# Patient Record
Sex: Male | Born: 1955 | Race: White | Hispanic: No | Marital: Single | Smoking: Current every day smoker
Health system: Southern US, Community
[De-identification: ages and names within clinical notes are randomized; demographics above are authoritative.]

---

## 2005-05-16 ENCOUNTER — Emergency Department: Payer: Self-pay | Admitting: Emergency Medicine

## 2011-09-09 ENCOUNTER — Emergency Department: Payer: Self-pay | Admitting: Emergency Medicine

## 2018-05-19 ENCOUNTER — Inpatient Hospital Stay
Admission: EM | Admit: 2018-05-19 | Discharge: 2018-05-23 | DRG: 641 | Disposition: A | Discharge status: Home Health | Payer: Self-pay | Attending: Family Medicine | Admitting: Family Medicine

## 2018-05-19 ENCOUNTER — Emergency Department: Payer: Self-pay

## 2018-05-19 ENCOUNTER — Other Ambulatory Visit: Payer: Self-pay

## 2018-05-19 DIAGNOSIS — F1010 Alcohol abuse, uncomplicated: Secondary | ICD-10-CM | POA: Diagnosis present

## 2018-05-19 DIAGNOSIS — E440 Moderate protein-calorie malnutrition: Secondary | ICD-10-CM

## 2018-05-19 DIAGNOSIS — Z79899 Other long term (current) drug therapy: Secondary | ICD-10-CM

## 2018-05-19 DIAGNOSIS — E871 Hypo-osmolality and hyponatremia: Principal | ICD-10-CM

## 2018-05-19 DIAGNOSIS — R001 Bradycardia, unspecified: Secondary | ICD-10-CM | POA: Diagnosis present

## 2018-05-19 DIAGNOSIS — R251 Tremor, unspecified: Secondary | ICD-10-CM

## 2018-05-19 DIAGNOSIS — M6282 Rhabdomyolysis: Secondary | ICD-10-CM | POA: Diagnosis present

## 2018-05-19 DIAGNOSIS — E876 Hypokalemia: Secondary | ICD-10-CM | POA: Diagnosis present

## 2018-05-19 DIAGNOSIS — K5900 Constipation, unspecified: Secondary | ICD-10-CM | POA: Diagnosis present

## 2018-05-19 DIAGNOSIS — F329 Major depressive disorder, single episode, unspecified: Secondary | ICD-10-CM | POA: Diagnosis present

## 2018-05-19 DIAGNOSIS — F17210 Nicotine dependence, cigarettes, uncomplicated: Secondary | ICD-10-CM | POA: Diagnosis present

## 2018-05-19 DIAGNOSIS — R64 Cachexia: Secondary | ICD-10-CM | POA: Diagnosis present

## 2018-05-19 DIAGNOSIS — M62838 Other muscle spasm: Secondary | ICD-10-CM | POA: Diagnosis present

## 2018-05-19 DIAGNOSIS — I10 Essential (primary) hypertension: Secondary | ICD-10-CM

## 2018-05-19 DIAGNOSIS — J449 Chronic obstructive pulmonary disease, unspecified: Secondary | ICD-10-CM

## 2018-05-19 DIAGNOSIS — Z681 Body mass index (BMI) 19 or less, adult: Secondary | ICD-10-CM

## 2018-05-19 LAB — URINE DRUG SCREEN, QUALITATIVE (ARMC ONLY)
Amphetamines, Ur Screen: NOT DETECTED
Barbiturates, Ur Screen: NOT DETECTED
Benzodiazepine, Ur Scrn: NOT DETECTED
CANNABINOID 50 NG, UR ~~LOC~~: NOT DETECTED
Cocaine Metabolite,Ur ~~LOC~~: NOT DETECTED
MDMA (ECSTASY) UR SCREEN: NOT DETECTED
Methadone Scn, Ur: NOT DETECTED
Opiate, Ur Screen: NOT DETECTED
PHENCYCLIDINE (PCP) UR S: NOT DETECTED
Tricyclic, Ur Screen: NOT DETECTED

## 2018-05-19 LAB — CBC
HCT: 37.8 % — ABNORMAL LOW (ref 40.0–52.0)
Hemoglobin: 13.8 g/dL (ref 13.0–18.0)
MCH: 33.5 pg (ref 26.0–34.0)
MCHC: 36.5 g/dL — ABNORMAL HIGH (ref 32.0–36.0)
MCV: 91.8 fL (ref 80.0–100.0)
Platelets: 250 10*3/uL (ref 150–440)
RBC: 4.11 MIL/uL — ABNORMAL LOW (ref 4.40–5.90)
RDW: 13.1 % (ref 11.5–14.5)
WBC: 12.4 10*3/uL — ABNORMAL HIGH (ref 3.8–10.6)

## 2018-05-19 LAB — BLOOD GAS, VENOUS
ACID-BASE EXCESS: 8 mmol/L — AB (ref 0.0–2.0)
Bicarbonate: 33.4 mmol/L — ABNORMAL HIGH (ref 20.0–28.0)
PATIENT TEMPERATURE: 37
pCO2, Ven: 48 mmHg (ref 44.0–60.0)
pH, Ven: 7.45 — ABNORMAL HIGH (ref 7.250–7.430)

## 2018-05-19 LAB — SODIUM
SODIUM: 123 mmol/L — AB (ref 135–145)
Sodium: 120 mmol/L — ABNORMAL LOW (ref 135–145)
Sodium: 123 mmol/L — ABNORMAL LOW (ref 135–145)

## 2018-05-19 LAB — BASIC METABOLIC PANEL
Anion gap: 12 (ref 5–15)
BUN: <5 mg/dL — ABNORMAL LOW (ref 6–20)
CO2: 29 mmol/L (ref 22–32)
Calcium: 8.5 mg/dL — ABNORMAL LOW (ref 8.9–10.3)
Chloride: 70 mmol/L — ABNORMAL LOW (ref 101–111)
Creatinine, Ser: 0.5 mg/dL — ABNORMAL LOW (ref 0.61–1.24)
GFR calc Af Amer: >60 mL/min (ref >60)
GFR calc non Af Amer: >60 mL/min (ref >60)
Glucose, Bld: 102 mg/dL — ABNORMAL HIGH (ref 65–99)
Potassium: 3.1 mmol/L — ABNORMAL LOW (ref 3.5–5.1)
Sodium: 111 mmol/L — CL (ref 135–145)

## 2018-05-19 LAB — TSH: TSH: 0.755 u[IU]/mL (ref 0.350–4.500)

## 2018-05-19 LAB — SODIUM, URINE, RANDOM: Sodium, Ur: <10 mmol/L

## 2018-05-19 LAB — MAGNESIUM: MAGNESIUM: 1 mg/dL — AB (ref 1.7–2.4)

## 2018-05-19 LAB — ETHANOL: Alcohol, Ethyl (B): <10 mg/dL (ref <10)

## 2018-05-19 LAB — PROTEIN / CREATININE RATIO, URINE
Creatinine, Urine: 13 mg/dL
Total Protein, Urine: <6 mg/dL

## 2018-05-19 LAB — CHLORIDE, URINE, RANDOM: Chloride Urine: <15 mmol/L

## 2018-05-19 LAB — TROPONIN I: Troponin I: <0.03 ng/mL (ref <0.03)

## 2018-05-19 LAB — CK: Total CK: 1597 U/L — ABNORMAL HIGH (ref 49–397)

## 2018-05-19 MED ORDER — POTASSIUM CHLORIDE IN NACL 20-0.9 MEQ/L-% IV SOLN
INTRAVENOUS | Status: DC
  Administered 2018-05-19: 14:00:00 via INTRAVENOUS
  Filled 2018-05-19 (×3): qty 1000

## 2018-05-19 MED ORDER — CITALOPRAM HYDROBROMIDE 20 MG PO TABS
40.0000 mg | ORAL_TABLET | Freq: Every day | ORAL | Status: DC
Start: 2018-05-19 — End: 2018-05-23
  Administered 2018-05-19 – 2018-05-23 (×5): 40 mg via ORAL
  Filled 2018-05-19 (×5): qty 2

## 2018-05-19 MED ORDER — POTASSIUM CHLORIDE CRYS ER 20 MEQ PO TBCR
60.0000 meq | EXTENDED_RELEASE_TABLET | Freq: Once | ORAL | Status: AC
  Administered 2018-05-19: 60 meq via ORAL
  Filled 2018-05-19: qty 3

## 2018-05-19 MED ORDER — POTASSIUM CL IN DEXTROSE 5% 20 MEQ/L IV SOLN
20.0000 meq | INTRAVENOUS | Status: DC
  Administered 2018-05-19 – 2018-05-20 (×2): 20 meq via INTRAVENOUS
  Filled 2018-05-19 (×3): qty 1000

## 2018-05-19 MED ORDER — ONDANSETRON HCL 4 MG/2ML IJ SOLN: 4.0000 mg | Freq: Four times a day (QID) | INTRAMUSCULAR | Status: DC | PRN

## 2018-05-19 MED ORDER — FAMOTIDINE IN NACL 20-0.9 MG/50ML-% IV SOLN
20.0000 mg | Freq: Two times a day (BID) | INTRAVENOUS | Status: DC
  Administered 2018-05-19 (×2): 20 mg via INTRAVENOUS
  Filled 2018-05-19 (×2): qty 50

## 2018-05-19 MED ORDER — BISACODYL 10 MG RE SUPP
10.0000 mg | Freq: Every day | RECTAL | Status: DC | PRN
  Filled 2018-05-19: qty 1

## 2018-05-19 MED ORDER — HEPARIN SODIUM (PORCINE) 5000 UNIT/ML IJ SOLN
5000.0000 [IU] | Freq: Three times a day (TID) | INTRAMUSCULAR | Status: DC
  Administered 2018-05-19 – 2018-05-23 (×12): 5000 [IU] via SUBCUTANEOUS
  Filled 2018-05-19 (×12): qty 1

## 2018-05-19 MED ORDER — SODIUM CHLORIDE 0.9 % IV SOLN
Freq: Once | INTRAVENOUS | Status: DC
  Administered 2018-05-19: 13:00:00 via INTRAVENOUS

## 2018-05-19 MED ORDER — DOCUSATE SODIUM 100 MG PO CAPS
100.0000 mg | ORAL_CAPSULE | Freq: Two times a day (BID) | ORAL | Status: DC
  Administered 2018-05-19 – 2018-05-23 (×7): 100 mg via ORAL
  Filled 2018-05-19 (×9): qty 1

## 2018-05-19 MED ORDER — ATORVASTATIN CALCIUM 20 MG PO TABS
20.0000 mg | ORAL_TABLET | Freq: Every day | ORAL | Status: DC
Start: 2018-05-19 — End: 2018-05-20
  Administered 2018-05-19: 20 mg via ORAL
  Filled 2018-05-19 (×2): qty 1

## 2018-05-19 MED ORDER — SODIUM CHLORIDE 0.9 % IV SOLN
Freq: Once | INTRAVENOUS | Status: AC
  Administered 2018-05-19: 10:00:00 via INTRAVENOUS

## 2018-05-19 MED ORDER — LORAZEPAM 2 MG/ML IJ SOLN: 1.0000 mg | Freq: Once | INTRAMUSCULAR | Status: DC

## 2018-05-19 MED ORDER — CLONAZEPAM 0.5 MG PO TABS
1.0000 mg | ORAL_TABLET | Freq: Three times a day (TID) | ORAL | Status: DC
  Administered 2018-05-19 – 2018-05-23 (×11): 1 mg via ORAL
  Filled 2018-05-19 (×12): qty 2

## 2018-05-19 MED ORDER — LISINOPRIL 20 MG PO TABS
20.0000 mg | ORAL_TABLET | Freq: Every day | ORAL | Status: DC
  Administered 2018-05-19 – 2018-05-23 (×5): 20 mg via ORAL
  Filled 2018-05-19 (×5): qty 1

## 2018-05-19 MED ORDER — MAGNESIUM SULFATE 2 GM/50ML IV SOLN
2.0000 g | Freq: Once | INTRAVENOUS | Status: AC
  Administered 2018-05-19: 2 g via INTRAVENOUS
  Filled 2018-05-19: qty 50

## 2018-05-19 MED ORDER — ALBUTEROL SULFATE (2.5 MG/3ML) 0.083% IN NEBU
5.0000 mg | INHALATION_SOLUTION | Freq: Once | RESPIRATORY_TRACT | Status: AC
  Administered 2018-05-19: 5 mg via RESPIRATORY_TRACT
  Filled 2018-05-19: qty 6

## 2018-05-19 MED ORDER — DIAZEPAM 5 MG PO TABS
10.0000 mg | ORAL_TABLET | Freq: Once | ORAL | Status: AC
  Administered 2018-05-19: 10 mg via ORAL
  Filled 2018-05-19: qty 2

## 2018-05-19 MED ORDER — IPRATROPIUM-ALBUTEROL 0.5-2.5 (3) MG/3ML IN SOLN
3.0000 mL | Freq: Three times a day (TID) | RESPIRATORY_TRACT | Status: DC
  Administered 2018-05-20 – 2018-05-22 (×7): 3 mL via RESPIRATORY_TRACT
  Filled 2018-05-19: qty 30
  Filled 2018-05-19 (×5): qty 3

## 2018-05-19 MED ORDER — METHYLPREDNISOLONE SODIUM SUCC 125 MG IJ SOLR
125.0000 mg | Freq: Once | INTRAMUSCULAR | Status: AC
  Administered 2018-05-19: 125 mg via INTRAVENOUS
  Filled 2018-05-19: qty 2

## 2018-05-19 MED ORDER — DESMOPRESSIN ACETATE 4 MCG/ML IJ SOLN
2.0000 ug | Freq: Once | INTRAMUSCULAR | Status: AC
  Administered 2018-05-19: 2 ug via SUBCUTANEOUS
  Filled 2018-05-19: qty 1

## 2018-05-19 MED ORDER — LORAZEPAM 2 MG/ML IJ SOLN: 0.5000 mg | INTRAMUSCULAR | Status: DC | PRN

## 2018-05-19 MED ORDER — ACETAMINOPHEN 650 MG RE SUPP: 650.0000 mg | Freq: Four times a day (QID) | RECTAL | Status: DC | PRN

## 2018-05-19 MED ORDER — ENSURE ENLIVE PO LIQD: 237.0000 mL | Freq: Two times a day (BID) | ORAL | Status: DC

## 2018-05-19 MED ORDER — ONDANSETRON HCL 4 MG/2ML IJ SOLN
4.0000 mg | Freq: Once | INTRAMUSCULAR | Status: AC
  Administered 2018-05-19: 4 mg via INTRAVENOUS
  Filled 2018-05-19: qty 2

## 2018-05-19 MED ORDER — ACETAMINOPHEN 325 MG PO TABS: 650.0000 mg | ORAL_TABLET | Freq: Four times a day (QID) | ORAL | Status: DC | PRN

## 2018-05-19 MED ORDER — ONDANSETRON HCL 4 MG PO TABS: 4.0000 mg | ORAL_TABLET | Freq: Four times a day (QID) | ORAL | Status: DC | PRN

## 2018-05-19 MED ORDER — LORAZEPAM 2 MG/ML IJ SOLN
1.0000 mg | Freq: Once | INTRAMUSCULAR | Status: AC
  Administered 2018-05-19: 1 mg via INTRAVENOUS
  Filled 2018-05-19: qty 1

## 2018-05-19 MED ORDER — DOXYCYCLINE HYCLATE 100 MG PO TABS
100.0000 mg | ORAL_TABLET | Freq: Two times a day (BID) | ORAL | Status: DC
  Administered 2018-05-19 – 2018-05-21 (×4): 100 mg via ORAL
  Filled 2018-05-19 (×6): qty 1

## 2018-05-19 MED ORDER — IPRATROPIUM-ALBUTEROL 0.5-2.5 (3) MG/3ML IN SOLN
3.0000 mL | Freq: Four times a day (QID) | RESPIRATORY_TRACT | Status: DC
  Administered 2018-05-19 (×2): 3 mL via RESPIRATORY_TRACT
  Filled 2018-05-19 (×2): qty 3

## 2018-05-19 MED ORDER — IPRATROPIUM-ALBUTEROL 0.5-2.5 (3) MG/3ML IN SOLN
3.0000 mL | Freq: Once | RESPIRATORY_TRACT | Status: AC
  Administered 2018-05-19: 3 mL via RESPIRATORY_TRACT
  Filled 2018-05-19: qty 3

## 2018-05-19 MED ORDER — PROPRANOLOL HCL 40 MG PO TABS
40.0000 mg | ORAL_TABLET | Freq: Two times a day (BID) | ORAL | Status: DC
  Administered 2018-05-19 – 2018-05-20 (×3): 40 mg via ORAL
  Filled 2018-05-19 (×6): qty 1

## 2018-05-19 NOTE — ED Triage Notes (Signed)
He arrives today via [facility] from home with reports of COPD exacerbation  - shortness of breath present upon arrival  Pt verbalizing paint to his bilateral legs  - they are shaking during triage  Nausea with 2 episodes of emesis over the last 24 hours

## 2018-05-19 NOTE — H&P (Signed)
History and Physical    LYRIK BURESH ZOX:[id number] DOB: 1956/11/18 DOA: 05/19/2018  Referring physician: Dr. Mayford Knife PCP: [facility]  Specialists: none  Chief Complaint: weakness and shaking  HPI: Jesus Turner is a 62 y.o. male has a past medical history significant for COPD who presents with acute onset(24h) of weakness and tremors with SOB. No fever. Deneis CP or palpitations. No change in bowels or bladder. Some vomiting but no diarrhea. In ER, sodium very low and CK elevated c/w rhabdomyolysis. Denies ETOH abuse. Does smoke. He is now admitted  Review of Systems: The patient denies anorexia, fever, weight loss,, vision loss, decreased hearing, hoarseness, chest pain, syncope, dyspnea on exertion, peripheral edema,  hemoptysis, abdominal pain, melena, hematochezia, severe indigestion/heartburn, hematuria, incontinence, genital sores, muscle weakness, suspicious skin lesions, transient blindness,  depression, unusual weight change, abnormal bleeding, enlarged lymph nodes, angioedema, and breast masses.   History reviewed. No pertinent past medical history. History reviewed. No pertinent surgical history. Social History:  reports that he has been smoking.  He has never used smokeless tobacco. He reports that he drinks alcohol. His drug history is not on file.  No Known Allergies  History reviewed. No pertinent family history.  Prior to Admission medications   Medication Sig Start Date End Date Taking? Authorizing Provider  citalopram (CELEXA) 40 MG tablet Take 1 tablet by mouth daily. 03/12/18  Yes [provider]  atorvastatin (LIPITOR) 20 MG tablet Take 1 tablet by mouth at bedtime. 02/14/18   [provider]  lisinopril (PRINIVIL,ZESTRIL) 20 MG tablet Take 20 mg by mouth daily. 02/19/18   [provider]  metoprolol tartrate (LOPRESSOR) 50 MG tablet Take 50 mg by mouth 2 (two) times daily. 02/14/18   [provider]    Physical Exam: Vitals:   05/19/18 1000 05/19/18 1045 05/19/18 1100 05/19/18 1232  BP: 116/82  (!) 107/95 (!) 136/106  Pulse: 99 (!) 107 (!) 107 (!) 103  Resp:      Temp:      TempSrc:      SpO2: 97% 100% 98%   Weight:      Height:         General:  No apparent distress, [place]/AT, cachectic  Eyes: PERRL, EOMI, no scleral icterus, conjunctiva clear  ENT: moist oropharynx without exudate, TM's benign, dentition poor  Neck: supple, no lymphadenopathy. No bruits or thyromegaly  Cardiovascular: rapid rate with regular without MRG; 2+ peripheral pulses, no JVD, no peripheral edema  Respiratory: CTA biL, good air movement without wheezing, rhonchi or crackled. Respiratory effort normal  Abdomen: soft, non tender to palpation, positive bowel sounds, no guarding, no rebound  Skin: no rashes or lesions  Musculoskeletal: normal bulk and tone, no joint swelling  Psychiatric: normal mood and affect, A&OX3  Neurologic: CN 2-12 grossly intact, Motor strength 5/5 in all 4 groups with symmetric DTR's and non-focal sensory exam. Tremulous  Labs on Admission:  Basic Metabolic Panel: Recent Labs  Lab 05/19/18 0953  NA 111*  K 3.1*  CL 70*  CO2 29  GLUCOSE 102*  BUN <5*  CREATININE 0.50*  CALCIUM 8.5*  MG 1.0*   Liver Function Tests: No results for input(s): AST, ALT, ALKPHOS, BILITOT, PROT, ALBUMIN in the last 168 hours. No results for input(s): LIPASE, AMYLASE in the last 168 hours. No results for input(s): AMMONIA in the last 168 hours. CBC: Recent Labs  Lab 05/19/18 0953  WBC 12.4*  HGB 13.8  HCT 37.8*  MCV  91.8  PLT 250   Cardiac Enzymes: Recent Labs  Lab 05/19/18 0953 05/19/18 1035  CKTOTAL 1,597*  --   TROPONINI  --  <0.03    BNP (last 3 results) No results for input(s): BNP in the last 8760 hours.  ProBNP (last 3 results) No results for input(s): PROBNP in the last 8760 hours.  CBG: No results for input(s): GLUCAP in the last 168  hours.  Radiological Exams on Admission: Dg Chest 2 View  Result Date: 05/19/2018 CLINICAL DATA:  Shortness of breath.  COPD exacerbation.  Smoker. EXAM: CHEST - 2 VIEW COMPARISON:  None. FINDINGS: Normal sized heart. Clear lungs. The lungs are hyperexpanded. Mildly elevated right hemidiaphragm. 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 Mild scoliosis. Diffuse osteopenia. Old, healed bilateral clavicle fractures. Old, healed bilateral rib fractures. Thoracic spine degenerative changes. There are also mild compression deformities of two of the upper thoracic spine vertebrae with no acute fracture lines or bony retropulsion seen. Cervical spine degenerative changes. Bilateral carotid artery calcifications, greater on the right. IMPRESSION: 1. 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 If the patient has no abdominal symptoms, this does not require further evaluation. If there are abdominal symptoms, a two view radiographic examination of the abdomen would be recommended, including a left lateral decubitus view. 2. Mild changes of COPD. 3. Bilateral atheromatous carotid artery calcifications, greater on the right. Electronically Signed   By: Beckie Salts M.D.   On: 05/19/2018 10:29   Dg Abd 2 Views  Result Date: 05/19/2018 CLINICAL DATA:  Possible free air seen on prior chest x-ray. EXAM: ABDOMEN - 2 VIEW COMPARISON:  Chest radiograph 05/19/2018 FINDINGS: Diffuse sub pathologic gaseous distension of small bowel loops in the central abdomen. Preserved colonic bowel gas pattern. No evidence of free intra-abdominal air. IMPRESSION: Nonspecific nonobstructive bowel gas pattern with diffuse sub pathologic gaseous distension of small bowel loops. No evidence of free intra-abdominal air. Electronically Signed   By: Ted Mcalpine M.D.   On: 05/19/2018 11:57    EKG: Independently  reviewed.  Assessment/Plan Principal Problem:   Hyponatremia Active Problems:   Tremor   COPD (chronic obstructive pulmonary disease) (HCC)   HTN (hypertension)   Will admit to floor with IV fluids and consult Nephrology and Neuro. Follow CK's. Begin BP meds and optimize pulmonary regimen. Wean off O2. Repeat labs in AM  Diet: regular Fluids: NS with K+@100  DVT Prophylaxis: SQ Heparin  Code Status: FULL  Family Communication: yes  Disposition Plan: home  Time spent: 50 min

## 2018-05-19 NOTE — Consult Note (Signed)
Date: 05/19/2018                  Patient Name:  Jesus Turner  MRN: [id number]  DOB: 18-Apr-1956  Age / Sex: 62 y.o., male         PCP: [facility]                 Service Requesting Consult: IM/ Marguarite Arbour, MD                 Reason for Consult:  Hyponatremia            History of Present Illness: Patient is a 62 y.o. male with medical problems of COPD,  who was admitted to [facility] on 05/19/2018 for evaluation of confusion.  Information is provided by patient's mother who does not live with him.  Patient just received Klonopin therefore is very lethargic and is not able to provide any meaningful information.  Per ER notes, he presented for difficulty breathing and bilateral leg pain.  Family also reports that he has been shaking a lot recently.  He also had 2 episodes of vomiting prior to presenting. In the ER, he was noted to have acutely low sodium of 111.  Potassium, chloride, magnesium are all low.  CK level is high at almost 1600.  Urine toxicity screen is negative.  Blood alcohol level is negative.  No historical lab results are available for comparison  Medications: Outpatient medications: Medications Prior to Admission  Medication Sig Dispense Refill Last Dose  . citalopram (CELEXA) 40 MG tablet Take 1 tablet by mouth daily.  0   . atorvastatin (LIPITOR) 20 MG tablet Take 1 tablet by mouth at bedtime.  0   . lisinopril (PRINIVIL,ZESTRIL) 20 MG tablet Take 20 mg by mouth daily.  0   . metoprolol tartrate (LOPRESSOR) 50 MG tablet Take 50 mg by mouth 2 (two) times daily.  0     Current medications: Current Facility-Administered Medications  Medication Dose Route Frequency Provider Last Rate Last Dose  . 0.9 % NaCl with KCl 20 mEq/ L  infusion   Intravenous Continuous Marguarite Arbour, MD 100 mL/hr at 05/19/18 1421    . acetaminophen (TYLENOL) tablet 650 mg  650 mg Oral Q6H PRN Marguarite Arbour, MD       Or  . acetaminophen (TYLENOL) suppository  650 mg  650 mg Rectal Q6H PRN Marguarite Arbour, MD      . atorvastatin (LIPITOR) tablet 20 mg  20 mg Oral QHS Marguarite Arbour, MD      . bisacodyl (DULCOLAX) suppository 10 mg  10 mg Rectal Daily PRN Marguarite Arbour, MD      . citalopram (CELEXA) tablet 40 mg  40 mg Oral Daily Marguarite Arbour, MD   40 mg at 05/19/18 1427  . clonazePAM (KLONOPIN) tablet 1 mg  1 mg Oral Q8H Marguarite Arbour, MD   1 mg at 05/19/18 1427  . docusate sodium (COLACE) capsule 100 mg  100 mg Oral BID Marguarite Arbour, MD   100 mg at 05/19/18 1427  . doxycycline (VIBRA-TABS) tablet 100 mg  100 mg Oral Q12H Marguarite Arbour, MD   100 mg at 05/19/18 1428  . famotidine (PEPCID) IVPB 20 mg premix  20 mg Intravenous Q12H Marguarite Arbour, MD   Stopped at 05/19/18 1516  . heparin injection 5,000 Units  5,000 Units Subcutaneous Q8H Sparks, Duane Lope, MD  5,000 Units at 05/19/18 1426  . ipratropium-albuterol (DUONEB) 0.5-2.5 (3) MG/3ML nebulizer solution 3 mL  3 mL Nebulization QID Marguarite Arbour, MD   3 mL at 05/19/18 1455  . lisinopril (PRINIVIL,ZESTRIL) tablet 20 mg  20 mg Oral Daily Marguarite Arbour, MD   20 mg at 05/19/18 1428  . LORazepam (ATIVAN) injection 0.5 mg  0.5 mg Intravenous Q4H PRN Marguarite Arbour, MD      . ondansetron [facility]) tablet 4 mg  4 mg Oral Q6H PRN Marguarite Arbour, MD       Or  . ondansetron (ZOFRAN) injection 4 mg  4 mg Intravenous Q6H PRN Marguarite Arbour, MD      . propranolol (INDERAL) tablet 40 mg  40 mg Oral BID Marguarite Arbour, MD   40 mg at 05/19/18 1428      Allergies: No Known Allergies    Past Medical History: History reviewed. No pertinent past medical history.   Past Surgical History: History reviewed. No pertinent surgical history.   Family History: History reviewed. No pertinent family history.   Social History: Social History   Socioeconomic History  . Marital status: Single    Spouse name: Not on file  . Number of children: Not on file  .  Years of education: Not on file  . Highest education level: Not on file  Occupational History  . Not on file  Social Needs  . Financial resource strain: Not on file  . Food insecurity:    Worry: Not on file    Inability: Not on file  . Transportation needs:    Medical: Not on file    Non-medical: Not on file  Tobacco Use  . Smoking status: Current Every Day Smoker  . Smokeless tobacco: Never Used  Substance and Sexual Activity  . Alcohol use: Yes  . Drug use: Not on file  . Sexual activity: Not on file  Lifestyle  . Physical activity:    Days per week: Not on file    Minutes per session: Not on file  . Stress: Not on file  Relationships  . Social connections:    Talks on phone: Not on file    Gets together: Not on file    Attends religious service: Not on file    Active member of club or organization: Not on file    Attends meetings of clubs or organizations: Not on file    Relationship status: Not on file  . Intimate partner violence:    Fear of current or ex partner: Not on file    Emotionally abused: Not on file    Physically abused: Not on file    Forced sexual activity: Not on file  Other Topics Concern  . Not on file  Social History Narrative  . Not on file     Review of Systems: Not available as patient is very lethargic Gen:  HEENT:  CV:  Resp:  GI: GU :  MS:  Derm:   Psych: Heme:  Neuro:  Endocrine  Vital Signs: Blood pressure 124/81, pulse 98, temperature 98 F (36.7 C), temperature source Oral, resp. rate (!) 22, height  (1.854 m), weight 65.7 kg (144 lb 12.8 oz), SpO2 94 %.   Intake/Output Summary (Last 24 hours) at 05/19/2018 1646 Last data filed at 05/19/2018 1516 Gross per 24 hour  Intake 84 ml  Output 950 ml  Net -866 ml    Weight trends: [employer]   05/19/18 [phone]  05/19/18 1350  Weight: 63 kg (139 lb) 65.7 kg (144 lb 12.8 oz)    Physical Exam: General:  Thin, cachectic, laying in the bed, lethargic  HEENT  moist  oral mucous membranes  Neck:  Supple   Lungs: normal breathing effort, clear to auscultation  Heart::  Regular, no rub or gallop  Abdomen:  Soft, nontender  Extremities:  No peripheral edema  Neurologic:  Lethargic, able to answer only few simple questions   Skin: no acute rashes             Lab results: Basic Metabolic Panel: Recent Labs  Lab 05/19/18 0953  NA 111*  K 3.1*  CL 70*  CO2 29  GLUCOSE 102*  BUN <5*  CREATININE 0.50*  CALCIUM 8.5*  MG 1.0*    Liver Function Tests: No results for input(s): AST, ALT, ALKPHOS, BILITOT, PROT, ALBUMIN in the last 168 hours. No results for input(s): LIPASE, AMYLASE in the last 168 hours. No results for input(s): AMMONIA in the last 168 hours.  CBC: Recent Labs  Lab 05/19/18 0953  WBC 12.4*  HGB 13.8  HCT 37.8*  MCV 91.8  PLT 250    Cardiac Enzymes: Recent Labs  Lab 05/19/18 0953 05/19/18 1035  CKTOTAL 1,597*  --   TROPONINI  --  <0.03    BNP: Invalid input(s): POCBNP  CBG: No results for input(s): GLUCAP in the last 168 hours.  Microbiology: No results found for this or any previous visit (from the past 720 hour(s)).   Coagulation Studies: No results for input(s): LABPROT, INR in the last 72 hours.  Urinalysis: No results for input(s): COLORURINE, LABSPEC, PHURINE, GLUCOSEU, HGBUR, BILIRUBINUR, KETONESUR, PROTEINUR, UROBILINOGEN, NITRITE, LEUKOCYTESUR in the last 72 hours.  Invalid input(s): APPERANCEUR      Imaging: Dg Chest 2 View  Result Date: 05/19/2018 CLINICAL DATA:  Shortness of breath.  COPD exacerbation.  Smoker. EXAM: CHEST - 2 VIEW COMPARISON:  None. FINDINGS: Normal sized heart. Clear lungs. The lungs are hyperexpanded. Mildly elevated right hemidiaphragm. 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 Mild scoliosis. Diffuse osteopenia. Old, healed bilateral clavicle fractures. Old, healed bilateral rib fractures. Thoracic spine  degenerative changes. There are also mild compression deformities of two of the upper thoracic spine vertebrae with no acute fracture lines or bony retropulsion seen. Cervical spine degenerative changes. Bilateral carotid artery calcifications, greater on the right. IMPRESSION: 1. 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 If the patient has no abdominal symptoms, this does not require further evaluation. If there are abdominal symptoms, a two view radiographic examination of the abdomen would be recommended, including a left lateral decubitus view. 2. Mild changes of COPD. 3. Bilateral atheromatous carotid artery calcifications, greater on the right. Electronically Signed   By: Beckie Salts M.D.   On: 05/19/2018 10:29   Dg Abd 2 Views  Result Date: 05/19/2018 CLINICAL DATA:  Possible free air seen on prior chest x-ray. EXAM: ABDOMEN - 2 VIEW COMPARISON:  Chest radiograph 05/19/2018 FINDINGS: Diffuse sub pathologic gaseous distension of small bowel loops in the central abdomen. Preserved colonic bowel gas pattern. No evidence of free intra-abdominal air. IMPRESSION: Nonspecific nonobstructive bowel gas pattern with diffuse sub pathologic gaseous distension of small bowel loops. No evidence of free intra-abdominal air. Electronically Signed   By: Ted Mcalpine M.D.   On: 05/19/2018 11:57      Assessment & Plan: Pt is a 62  y.o. Caucasian  male with a PMHX of COPD, HTN, was admitted on 05/19/2018 with confusion, tremors  and is found to have acute hyponatremia.   1.  Hyponatremia 2.  Hypokalemia 3.  Hypomagnesemia 4.  Rhabdomyolysis  Patient is found to have acute hyponatremia.  His mother reports that he eats erratically and drinks alcohol.  She is not sure of the quantity.  Electrolyte abnormalities including hyponatremia  are likely related to severe malnutrition.  CK level is elevated suggesting rhabdomyolysis.  Patient was treated with normal saline  bolus and now has been placed on normal saline with potassium chloride at 100 cc/h. Discussed with mother that Foley cathter will be placed for patient comfort as he is voiding frequently and is not coherent enough to manage. TSH, Cortisol. SPEP, UPEP have been ordered. CXR is negative for obvious mass  Recommendations: Agree with normal saline administration but patient is critically ill and will require frequent monitoring of serum sodium.  I have ordered sodium check every 2 hours.  Goal for correction of sodium is about 6 to 8 mEq in 24 hours. If Na level is correcting fast, suggest administration of dDAVP 1-2 mcg SQ q8-12 hours as needed ans slowing the rate of administration of saline or discontinuing it     LOS: 0 Mosetta Pigeon 5/26/20194:46 PM  [facility] [place], [place] [phone]  Note: This note was prepared with Dragon dictation. Any transcription errors are unintentional

## 2018-05-19 NOTE — Progress Notes (Signed)
Na 123  - start D5W  cc/hr to slow the rate of correction

## 2018-05-19 NOTE — Progress Notes (Signed)
Na 120 Plan: D/c NS dDAVP Scutaneous Continue to monitor closely

## 2018-05-19 NOTE — ED Provider Notes (Signed)
[facility] Emergency Department Provider Note       Time seen: ----------------------------------------- 10:11 AM on 05/19/2018 -----------------------------------------   I have reviewed the triage vital signs and the nursing notes.  HISTORY   Chief Complaint COPD and Leg Pain    HPI Jesus Turner is a 62 y.o. male with a history of COPD who presents to the ED for difficulty breathing as well as bilateral leg pain.  Patient reports his legs have been aching and shaking all night.  He is also had 2 episodes of vomiting in the last 24 hours and did not sleep last night due to the symptoms.  He states this is never happened to this extent before.  He denies fevers, chills, chest pain but has shortness of breath, denies diarrhea but again has vomiting.  No past medical history on file.  There are no active problems to display for this patient.  Allergies Patient has no known allergies.  Social History Social History   Tobacco Use  . Smoking status: Current Every Day Smoker  . Smokeless tobacco: Never Used  Substance Use Topics  . Alcohol use: Yes  . Drug use: Not on file   Review of Systems Constitutional: Negative for fever. Cardiovascular: Negative for chest pain. Respiratory: Positive for shortness of breath, cough Gastrointestinal: Negative for abdominal pain, vomiting and diarrhea. Genitourinary: Negative for dysuria. Musculoskeletal: Positive for diffuse leg shaking and pain Skin: Positive for diaphoresis Neurological: Negative for headaches, positive for weakness  All systems negative/normal/unremarkable except as stated in the HPI  ____________________________________________   PHYSICAL EXAM:  VITAL SIGNS: ED Triage Vitals  Enc Vitals Group     BP --      Pulse Rate 05/19/18 0950 100     Resp 05/19/18 0950 (!) 25     Temp 05/19/18 0950 98.2 F (36.8 C)     Temp Source 05/19/18 0950 Oral     SpO2 05/19/18 0949 97 %      Weight 05/19/18 0951 139 lb (63 kg)     Height 05/19/18 0951  (1.854 m)     Head Circumference --      Peak Flow --      Pain Score 05/19/18 0951 9     Pain Loc --      Pain Edu? --      Excl. in GC? --    Constitutional: Alert and oriented.  Mild to moderate distress Eyes: Conjunctivae are normal. Normal extraocular movements. ENT   Head: Normocephalic and atraumatic.   Nose: No congestion/rhinnorhea.   Mouth/Throat: Mucous membranes are moist.   Neck: No stridor. Cardiovascular: Rapid rate, regular rhythm. No murmurs, rubs, or gallops. Respiratory: Tachypnea with mild wheezing bilaterally Gastrointestinal: Soft and nontender. Normal bowel sounds Musculoskeletal: Nontender with normal range of motion in extremities. No lower extremity tenderness nor edema. Neurologic:  Normal speech and language. No gross focal neurologic deficits are appreciated.  Skin:  Skin is warm, profuse diaphoresis is noted Psychiatric: Mood and affect are normal. Speech and behavior are normal.  ____________________________________________  EKG: Interpreted by me.  Sinus tachycardia with a rate of 100 bpm, baseline artifact, left axis deviation, possible total infarct age-indeterminate, normal QT.  ____________________________________________  ED COURSE:  As part of my medical decision making, I reviewed the following data within the electronic MEDICAL RECORD NUMBER History obtained from family if available, nursing notes, old chart and ekg, as well as notes from prior ED visits. Patient presented for dyspnea as  well as leg pain and tremors, we will assess with labs and imaging as indicated at this time. Clinical Course as of May 20 1131  Sun May 19, 2018  1036 Sodium(!!): 111 [JW]  1036 Chloride(!): 70 [JW]    Clinical Course User Index [JW] Emily Filbert, MD   Procedures ____________________________________________   LABS (pertinent positives/negatives)  Labs Reviewed   BASIC METABOLIC PANEL - Abnormal; Notable for the following components:      Result Value   Sodium 111 (*)    Potassium 3.1 (*)    Chloride 70 (*)    Glucose, Bld 102 (*)    BUN <5 (*)    Creatinine, Ser 0.50 (*)    Calcium 8.5 (*)    All other components within normal limits  CBC - Abnormal; Notable for the following components:   WBC 12.4 (*)    RBC 4.11 (*)    HCT 37.8 (*)    MCHC 36.5 (*)    All other components within normal limits  BLOOD GAS, VENOUS - Abnormal; Notable for the following components:   pH, Ven 7.45 (*)    Bicarbonate 33.4 (*)    Acid-Base Excess 8.0 (*)    All other components within normal limits  URINE DRUG SCREEN, QUALITATIVE ([facility] ONLY)  CK  ETHANOL  TROPONIN I  MAGNESIUM    RADIOLOGY Images were viewed by me  Chest x-ray IMPRESSION: 1. 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 If the patient has no abdominal symptoms, this does not require further evaluation. If there are abdominal symptoms, a two view radiographic examination of the abdomen would be recommended, including a left lateral decubitus view. 2. Mild changes of COPD. 3. Bilateral atheromatous carotid artery calcifications, greater on the right. ____________________________________________  CRITICAL CARE Performed by: Ulice Dash   Total critical care time: 30 minutes  Critical care time was exclusive of separately billable procedures and treating other patients.  Critical care was necessary to treat or prevent imminent or life-threatening deterioration.  Critical care was time spent personally by me on the following activities: development of treatment plan with patient and/or surrogate as well as nursing, discussions with consultants, evaluation of patient's response to treatment, examination of patient, obtaining history from patient or surrogate, ordering and performing treatments and interventions, ordering and review  of laboratory studies, ordering and review of radiographic studies, pulse oximetry and re-evaluation of patient's condition.   DIFFERENTIAL DIAGNOSIS   COPD exacerbation, anxiety, panic attack, rhabdomyolysis, dehydration, electrolyte abnormality  FINAL ASSESSMENT AND PLAN  Dyspnea, muscle spasms, severe hyponatremia, mild hypokalemia   Plan: The patient had presented for [place] of breath and leg pain. Patient's labs revealed numerous abnormalities including severe sodium and chloride depletion.  He was started on saline, received a bolus and was started on a normal saline infusion.  His potassium was also borderline low at 3.1, he was given oral potassium.  Prior to this he was given oral Valium and IV Ativan for muscle spasms and what appears to be anxiety.  He was having severe tremors in his legs.  He states he drinks on occasion but nothing recent or heavy. Patient's imaging revealed COPD changes but otherwise no acute process.  Patient will require admission to the hospital for his severe electrolyte abnormalities and tremors.  I will discuss with the hospitalist for admission.   Ulice Dash, MD   Note: This note was generated in part or whole with voice  recognition software. Voice recognition is usually quite accurate but there are transcription errors that can and very often do occur. I apologize for any typographical errors that were not detected and corrected.     Emily Filbert, MD 05/19/18 1134

## 2018-05-20 DIAGNOSIS — E871 Hypo-osmolality and hyponatremia: Principal | ICD-10-CM

## 2018-05-20 DIAGNOSIS — E440 Moderate protein-calorie malnutrition: Secondary | ICD-10-CM

## 2018-05-20 LAB — SODIUM: Sodium: 123 mmol/L — ABNORMAL LOW (ref 135–145)

## 2018-05-20 LAB — COMPREHENSIVE METABOLIC PANEL
ALT: 18 U/L (ref 17–63)
AST: 57 U/L — AB (ref 15–41)
Albumin: 3.5 g/dL (ref 3.5–5.0)
Alkaline Phosphatase: 61 U/L (ref 38–126)
Anion gap: 8 (ref 5–15)
BILIRUBIN TOTAL: 1 mg/dL (ref 0.3–1.2)
BUN: 5 mg/dL — AB (ref 6–20)
CO2: 30 mmol/L (ref 22–32)
Calcium: 8.5 mg/dL — ABNORMAL LOW (ref 8.9–10.3)
Chloride: 85 mmol/L — ABNORMAL LOW (ref 101–111)
Creatinine, Ser: 0.55 mg/dL — ABNORMAL LOW (ref 0.61–1.24)
GFR calc Af Amer: >60 mL/min (ref >60)
GFR calc non Af Amer: >60 mL/min (ref >60)
GLUCOSE: 120 mg/dL — AB (ref 65–99)
POTASSIUM: 3.4 mmol/L — AB (ref 3.5–5.1)
Sodium: 123 mmol/L — ABNORMAL LOW (ref 135–145)
TOTAL PROTEIN: 6.4 g/dL — AB (ref 6.5–8.1)

## 2018-05-20 LAB — CBC
HCT: 35.9 % — ABNORMAL LOW (ref 40.0–52.0)
Hemoglobin: 13.1 g/dL (ref 13.0–18.0)
MCH: 33.7 pg (ref 26.0–34.0)
MCHC: 36.4 g/dL — AB (ref 32.0–36.0)
MCV: 92.5 fL (ref 80.0–100.0)
Platelets: 210 10*3/uL (ref 150–440)
RBC: 3.88 MIL/uL — ABNORMAL LOW (ref 4.40–5.90)
RDW: 13.5 % (ref 11.5–14.5)
WBC: 4.1 10*3/uL (ref 3.8–10.6)

## 2018-05-20 LAB — CK: Total CK: 1131 U/L — ABNORMAL HIGH (ref 49–397)

## 2018-05-20 LAB — CORTISOL: Cortisol, Plasma: 3.4 ug/dL

## 2018-05-20 LAB — OSMOLALITY, URINE: OSMOLALITY UR: 66 mosm/kg — AB (ref 300–900)

## 2018-05-20 MED ORDER — MAGNESIUM SULFATE 2 GM/50ML IV SOLN
2.0000 g | Freq: Once | INTRAVENOUS | Status: AC
  Administered 2018-05-20: 14:00:00 2 g via INTRAVENOUS
  Filled 2018-05-20: qty 50

## 2018-05-20 MED ORDER — ADULT MULTIVITAMIN W/MINERALS CH
1.0000 | ORAL_TABLET | Freq: Every day | ORAL | Status: DC
  Administered 2018-05-21 – 2018-05-23 (×3): 1 via ORAL
  Filled 2018-05-20 (×3): qty 1

## 2018-05-20 MED ORDER — FAMOTIDINE 20 MG PO TABS
20.0000 mg | ORAL_TABLET | Freq: Two times a day (BID) | ORAL | Status: DC
  Administered 2018-05-20 – 2018-05-23 (×7): 20 mg via ORAL
  Filled 2018-05-20 (×8): qty 1

## 2018-05-20 NOTE — Plan of Care (Signed)
  Problem: Education: Goal: Knowledge of General Education information will improve Outcome: Progressing   Problem: Health Behavior/Discharge Planning: Goal: Ability to manage health-related needs will improve Outcome: Progressing   Problem: Clinical Measurements: Goal: Ability to maintain clinical measurements within normal limits will improve Outcome: Progressing Goal: Diagnostic test results will improve Outcome: Progressing Goal: Respiratory complications will improve Outcome: Progressing Goal: Cardiovascular complication will be avoided Outcome: Progressing   

## 2018-05-20 NOTE — Progress Notes (Signed)
Initial Nutrition Assessment  DOCUMENTATION CODES:   Non-severe (moderate) malnutrition in context of social or environmental circumstances, Underweight  INTERVENTION:  Continue Ensure Enlive po BID, each supplement provides 350 kcal and 20 grams of protein.  Encouraged patient to drink an oral nutrition supplement at home. Discussed cheaper alternatives to Ensure including store brands, a protein shake from [employer], and milk/chocolate milk.  Discussed easy-to-prepare meals patient can have at home that contain adequate calories and protein and are affordable.  Provide daily MVI.  Monitor magnesium, potassium, and phosphorus daily for at least 3 days, MD to replete as needed, as pt is at risk for refeeding syndrome given moderate malnutrition.  NUTRITION DIAGNOSIS:   Moderate Malnutrition related to social / environmental circumstances(limited ability to prepare well-balanced meals at home; also increased needs in setting of COPD) as evidenced by moderate fat depletion, moderate muscle depletion, severe muscle depletion.  GOAL:   Patient will meet greater than or equal to 90% of their needs  MONITOR:   PO intake, Supplement acceptance, Labs, Weight trends, I & O's  REASON FOR ASSESSMENT:   Malnutrition Screening Tool    ASSESSMENT:   62 year old male with PMHx of COPD, HTN, depression who was admitted with severe hyponatremia, hypokalemia, hypomagnesemia, rhabdomyolysis.   Met with patient at bedside. He reports he has had a poor appetite for the past year now. He lives alone with his dog and two kittens and reports there is no need to fix a full meal for just himself. He typically has beans from a can or potato chips for his meal. Lately he also has been too hot to cook because he does not have any air conditioning. He reports he had some nausea last night. Other denies any abdominal pain, difficulty chewing/swallowing or constipation/diarrhea. He reports he does not have any  issues getting to the grocery store. He tried Ensure while RD was in room and reports he can drink it. Discussed importance of obtaining adequate calories and protein to prevent any further unintentional weight loss.  Patient reports UBW was 180 lbs. He has lost around 40 lbs (22.2% body weight) over an unknown time period. No weight history in chart.  Meal Completion: 100% of breakfast this morning  Medications reviewed and include: famotidine, lisinopril, D5W with KCl at 50 mL/hr, magnesium sulfate 2 grams IV today.  Labs reviewed: Sodium 123, Potassium 3.4, Chloride 85, BUN 5, Creatinine 0.55.  Discussed with RN.  NUTRITION - FOCUSED PHYSICAL EXAM:    Most Recent Value  Orbital Region  Moderate depletion  Upper Arm Region  Severe depletion  Thoracic and Lumbar Region  Moderate depletion  Buccal Region  Moderate depletion  Temple Region  Severe depletion  Clavicle Bone Region  Moderate depletion  Clavicle and Acromion Bone Region  Moderate depletion  Scapular Bone Region  Severe depletion  Dorsal Hand  Moderate depletion  Patellar Region  Moderate depletion  Anterior Thigh Region  Moderate depletion  Posterior Calf Region  Moderate depletion  Edema (RD Assessment)  None  Hair  Reviewed  Eyes  Reviewed  Mouth  Reviewed  Skin  Reviewed  Nails  Reviewed     Diet Order:   Diet Order           Diet regular Room service appropriate? Yes; Fluid consistency: Thin  Diet effective now          EDUCATION NEEDS:   Education needs have been addressed  Skin:  Skin Assessment: Reviewed RN Assessment  Last BM:  PTA (05/17/2018 per chart)  Height:   Ht Readings from Last 1 Encounters:  05/19/18 '6\' 1"'$  (1.854 m)    Weight:   Wt Readings from Last 1 Encounters:  05/20/18 139 lb 11.2 oz (63.4 kg)    Ideal Body Weight:  83.6 kg  BMI:  Body mass index is 18.43 kg/m.  Estimated Nutritional Needs:   Kcal:  1800-2100 (MSJ x 1.2-1.4)  Protein:  90-100 grams (1.4-1.6  grams/kg)  Fluid:  1.8-2.1 L/day (1 mL/kcal)  Willey Blade, MS, RD, LDN Office: [phone] Pager: [phone] After Hours/Weekend Pager: [phone]

## 2018-05-20 NOTE — Progress Notes (Signed)
PHARMACIST - PHYSICIAN COMMUNICATION  CONCERNING: IV to Oral Route Change Policy  RECOMMENDATION: This patient is receiving famotidine by the intravenous route.  Based on criteria approved by the Pharmacy and Therapeutics Committee, the intravenous medication(s) is/are being converted to the equivalent oral dose form(s).   DESCRIPTION: These criteria include:  The patient is eating (either orally or via tube) and/or has been taking other orally administered medications for a least 24 hours  The patient has no evidence of active gastrointestinal bleeding or impaired GI absorption (gastrectomy, short bowel, patient on TNA or NPO).  If you have questions about this conversion, please contact the Pharmacy Department    [phone] )  Jeani Hawking   [phone] )  [facility]   [phone] )  [facility]   [phone] )  [facility]   [phone] )  [facility]   Nathian Stencil L, [facility] 05/20/2018 9:27 AM

## 2018-05-20 NOTE — Care Management Note (Signed)
Case Management Note  Patient Details  Name: [name] MRN: [id number] Date of Birth: Sep 12, 1956  Subjective/Objective:  Admitted to [facility] with the diagnosis of hyponatremia. Lives alone. Mother is Cherie Dark [phone]). Last seen at [facility] about a month ago. Gets prescriptions filled at [facility] in [place].No home health. No skilled facility. No home oxygen. Rolaytor available, if needed. Takes care of all basic activities of daily living himself. Lost a lot of weight, not sure about the time period, Last fall was a year ago. Mother will transport. No insurance listed. States that [facility] has advised him to go to the Department of  Social Services and apply for [employer].  Discussed these recommendations.             Action/Plan: Application for Medication Management and [facility] given   Expected Discharge Date:                  Expected Discharge Plan:     In-House Referral:     Discharge planning Services     Post Acute Care Choice:    Choice offered to:     DME Arranged:    DME Agency:     HH Arranged:    HH Agency:     Status of Service:     If discussed at [employer], dates discussed:    Additional Comments:  Gwenette Greet, RN MSN CCM Care Management [phone] 05/20/2018, 9:09 AM

## 2018-05-20 NOTE — Progress Notes (Signed)
[facility]  ROUNDING NOTE   Subjective:  Patient much more awake and alert as compared to what was described in the original consult note. She has not been eating or drinking very well at home at all. Serum sodium did rise to 123. Therefore he was started on D5W to slow rate of correction.   Objective:  Vital signs in last 24 hours:  Temp:  [98 F (36.7 C)-98.3 F (36.8 C)] 98.1 F (36.7 C) (05/27 0541) Pulse Rate:  [64-120] 68 (05/27 0541) Resp:  [16-27] 16 (05/27 0541) BP: (98-150)/(80-116) 124/80 (05/27 0541) SpO2:  [88 %-99 %] 97 % (05/27 0748) Weight:  [63.4 kg (139 lb 11.2 oz)-65.7 kg (144 lb 12.8 oz)] 63.4 kg (139 lb 11.2 oz) (05/27 0541)  Weight change:  Filed Weights   05/19/18 0951 05/19/18 1350 05/20/18 0541  Weight: 63 kg (139 lb) 65.7 kg (144 lb 12.8 oz) 63.4 kg (139 lb 11.2 oz)    Intake/Output: I/O last 3 completed shifts: In: 634.8 [I.V.:534.8; IV Piggyback:100] Out: 3650 [Urine:3650]   Intake/Output this shift:  Total I/O In: 360 [P.O.:360] Out: -   Physical Exam: General: No acute distress  Head: Normocephalic, atraumatic. Moist oral mucosal membranes  Eyes: Anicteric  Neck: Supple, trachea midline  Lungs:  Clear to auscultation, normal effort  Heart: S1S2 no rubs  Abdomen:  Soft, nontender, bowel sounds present  Extremities: No peripheral edema.  Neurologic: Awake, alert, following commands  Skin: No lesions       Basic Metabolic Panel: Recent Labs  Lab 05/19/18 0953 05/19/18 1710 05/19/18 2058 05/19/18 2255 05/20/18 0119 05/20/18 0537  NA 111* 120* 123* 123* 123* 123*  K 3.1*  --   --   --  3.4*  --   CL 70*  --   --   --  85*  --   CO2 29  --   --   --  30  --   GLUCOSE 102*  --   --   --  120*  --   BUN <5*  --   --   --  5*  --   CREATININE 0.50*  --   --   --  0.55*  --   CALCIUM 8.5*  --   --   --  8.5*  --   MG 1.0*  --   --   --   --   --     Liver Function Tests: Recent Labs  Lab 05/20/18 0119  AST  57*  ALT 18  ALKPHOS 61  BILITOT 1.0  PROT 6.4*  ALBUMIN 3.5   No results for input(s): LIPASE, AMYLASE in the last 168 hours. No results for input(s): AMMONIA in the last 168 hours.  CBC: Recent Labs  Lab 05/19/18 0953 05/20/18 0119  WBC 12.4* 4.1  HGB 13.8 13.1  HCT 37.8* 35.9*  MCV 91.8 92.5  PLT 250 210    Cardiac Enzymes: Recent Labs  Lab 05/19/18 0953 05/19/18 1035 05/20/18 0119  CKTOTAL 1,597*  --  1,131*  TROPONINI  --  <0.03  --     BNP: Invalid input(s): POCBNP  CBG: No results for input(s): GLUCAP in the last 168 hours.  Microbiology: No results found for this or any previous visit.  Coagulation Studies: No results for input(s): LABPROT, INR in the last 72 hours.  Urinalysis: No results for input(s): COLORURINE, LABSPEC, PHURINE, GLUCOSEU, HGBUR, BILIRUBINUR, KETONESUR, PROTEINUR, UROBILINOGEN, NITRITE, LEUKOCYTESUR in the last 72 hours.  Invalid input(s):  APPERANCEUR    Imaging: Dg Chest 2 View  Result Date: 05/19/2018 CLINICAL DATA:  Shortness of breath.  COPD exacerbation.  Smoker. EXAM: CHEST - 2 VIEW COMPARISON:  None. FINDINGS: Normal sized heart. Clear lungs. The lungs are hyperexpanded. Mildly elevated right hemidiaphragm. 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 Mild scoliosis. Diffuse osteopenia. Old, healed bilateral clavicle fractures. Old, healed bilateral rib fractures. Thoracic spine degenerative changes. There are also mild compression deformities of two of the upper thoracic spine vertebrae with no acute fracture lines or bony retropulsion seen. Cervical spine degenerative changes. Bilateral carotid artery calcifications, greater on the right. IMPRESSION: 1. 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 If the patient has no abdominal symptoms, this does not require further evaluation. If there are abdominal symptoms, a two view radiographic  examination of the abdomen would be recommended, including a left lateral decubitus view. 2. Mild changes of COPD. 3. Bilateral atheromatous carotid artery calcifications, greater on the right. Electronically Signed   By: Beckie Salts M.D.   On: 05/19/2018 10:29   Dg Abd 2 Views  Result Date: 05/19/2018 CLINICAL DATA:  Possible free air seen on prior chest x-ray. EXAM: ABDOMEN - 2 VIEW COMPARISON:  Chest radiograph 05/19/2018 FINDINGS: Diffuse sub pathologic gaseous distension of small bowel loops in the central abdomen. Preserved colonic bowel gas pattern. No evidence of free intra-abdominal air. IMPRESSION: Nonspecific nonobstructive bowel gas pattern with diffuse sub pathologic gaseous distension of small bowel loops. No evidence of free intra-abdominal air. Electronically Signed   By: Ted Mcalpine M.D.   On: 05/19/2018 11:57     Medications:   . dextrose 5 % with KCl 20 mEq / L 20 mEq (05/19/18 2304)   . citalopram  40 mg Oral Daily  . clonazePAM  1 mg Oral Q8H  . docusate sodium  100 mg Oral BID  . doxycycline  100 mg Oral Q12H  . famotidine  20 mg Oral BID  . feeding supplement (ENSURE ENLIVE)  237 mL Oral BID BM  . heparin  5,000 Units Subcutaneous Q8H  . ipratropium-albuterol  3 mL Nebulization TID  . lisinopril  20 mg Oral Daily  . propranolol  40 mg Oral BID   acetaminophen **OR** acetaminophen, bisacodyl, LORazepam, ondansetron **OR** ondansetron (ZOFRAN) IV  Assessment/ Plan:  62 y.o. male  male with a PMHX of COPD, HTN, was admitted on 05/19/2018 with confusion, tremors  and is found to have acute hyponatremia.   1.  Hyponatremia 2.  Hypokalemia 3.  Hypomagnesemia 4.  Rhabdomyolysis   Plan:  The patient was found to have a very poor diet at home.  It appears that he was not even eating one regular meal per day.  He states that he did not see the sense in preparing meals as he is single.  We counseled the patient against this and advised him to eat at least 2  regular meals per day.  He verbalized understanding of this.  Serum sodium did increase significantly therefore he was administered DDAVP as well as D5W.  Continue D5W through today.  If sodium remains stable at 123 we would recommend reinitiating 0.9 normal saline tomorrow at a slow rate.  Further plan as patient progresses.   LOS: 1 Tam Savoia 5/27/201911:45 AM

## 2018-05-20 NOTE — Consult Note (Signed)
Reason for Consult: tremor Referring Physician: Dr. Judithann Sheen   CC: tremors   HPI: Jesus Turner is an 62 y.o. male has a past medical history significant for COPD who presents with acute onset of generalized weakness and tremors with SOB. Elevated CK/dyhdration and hyponatremia    History reviewed. No pertinent past medical history.  History reviewed. No pertinent surgical history.  History reviewed. No pertinent family history.  Social History:  reports that he has been smoking.  He has never used smokeless tobacco. He reports that he drinks alcohol. His drug history is not on file.  No Known Allergies  Medications: I have reviewed the patient's current medications.  ROS: History obtained from the patient  General ROS: negative for - chills, fatigue, fever, night sweats, weight gain or weight loss Psychological ROS: negative for - behavioral disorder, hallucinations, memory difficulties, mood swings or suicidal ideation Ophthalmic ROS: negative for - blurry vision, double vision, eye pain or loss of vision ENT ROS: negative for - epistaxis, nasal discharge, oral lesions, sore throat, tinnitus or vertigo Allergy and Immunology ROS: negative for - hives or itchy/watery eyes Hematological and Lymphatic ROS: negative for - bleeding problems, bruising or swollen lymph nodes Endocrine ROS: negative for - galactorrhea, hair pattern changes, polydipsia/polyuria or temperature intolerance Respiratory ROS: negative for - cough, hemoptysis, shortness of breath or wheezing Cardiovascular ROS: negative for - chest pain, dyspnea on exertion, edema or irregular heartbeat Gastrointestinal ROS: negative for - abdominal pain, diarrhea, hematemesis, nausea/vomiting or stool incontinence Genito-Urinary ROS: negative for - dysuria, hematuria, incontinence or urinary frequency/urgency Musculoskeletal ROS: negative for - joint swelling or muscular weakness Neurological ROS: as noted in  HPI Dermatological ROS: negative for rash and skin lesion changes  Physical Examination: Blood pressure 124/80, pulse 68, temperature 98.1 F (36.7 C), temperature source Oral, resp. rate 16, height  (1.854 m), weight 139 lb 11.2 oz (63.4 kg), SpO2 97 %.   Neurological Examination   Mental Status: Alert, oriented, thought content appropriate.  Speech fluent without evidence of aphasia.  Able to follow 3 step commands without difficulty. Cranial Nerves: II: Discs flat bilaterally; Visual fields grossly normal, pupils equal, round, reactive to light and accommodation III,IV, VI: ptosis not present, extra-ocular motions intact bilaterally V,VII: smile symmetric, facial light touch sensation normal bilaterally VIII: hearing normal bilaterally IX,X: gag reflex present XI: bilateral shoulder shrug XII: midline tongue extension Motor: Generalized weakness Tone and bulk:normal tone throughout; no atrophy noted Sensory: Pinprick and light touch intact throughout, bilaterally Deep Tendon Reflexes: 1+ and symmetric throughout Plantars: Right: downgoing   Left: downgoing Cerebellar: normal finger-to-nose, normal rapid alternating movements and normal heel-to-shin test Gait: not tested       Laboratory Studies:   Basic Metabolic Panel: Recent Labs  Lab 05/19/18 0953 05/19/18 1710 05/19/18 2058 05/19/18 2255 05/20/18 0119 05/20/18 0537  NA 111* 120* 123* 123* 123* 123*  K 3.1*  --   --   --  3.4*  --   CL 70*  --   --   --  85*  --   CO2 29  --   --   --  30  --   GLUCOSE 102*  --   --   --  120*  --   BUN <5*  --   --   --  5*  --   CREATININE 0.50*  --   --   --  0.55*  --   CALCIUM 8.5*  --   --   --  8.5*  --   MG 1.0*  --   --   --   --   --     Liver Function Tests: Recent Labs  Lab 05/20/18 0119  AST 57*  ALT 18  ALKPHOS 61  BILITOT 1.0  PROT 6.4*  ALBUMIN 3.5   No results for input(s): LIPASE, AMYLASE in the last 168 hours. No results for input(s):  AMMONIA in the last 168 hours.  CBC: Recent Labs  Lab 05/19/18 0953 05/20/18 0119  WBC 12.4* 4.1  HGB 13.8 13.1  HCT 37.8* 35.9*  MCV 91.8 92.5  PLT 250 210    Cardiac Enzymes: Recent Labs  Lab 05/19/18 0953 05/19/18 1035 05/20/18 0119  CKTOTAL 1,597*  --  1,131*  TROPONINI  --  <0.03  --     BNP: Invalid input(s): POCBNP  CBG: No results for input(s): GLUCAP in the last 168 hours.  Microbiology: No results found for this or any previous visit.  Coagulation Studies: No results for input(s): LABPROT, INR in the last 72 hours.  Urinalysis: No results for input(s): COLORURINE, LABSPEC, PHURINE, GLUCOSEU, HGBUR, BILIRUBINUR, KETONESUR, PROTEINUR, UROBILINOGEN, NITRITE, LEUKOCYTESUR in the last 168 hours.  Invalid input(s): APPERANCEUR  Lipid Panel:  No results found for: CHOL, TRIG, HDL, CHOLHDL, VLDL, LDLCALC  HgbA1C: No results found for: HGBA1C  Urine Drug Screen:      Component Value Date/Time   LABOPIA NONE DETECTED 05/19/2018 1035   COCAINSCRNUR NONE DETECTED 05/19/2018 1035   LABBENZ NONE DETECTED 05/19/2018 1035   AMPHETMU NONE DETECTED 05/19/2018 1035   THCU NONE DETECTED 05/19/2018 1035   LABBARB NONE DETECTED 05/19/2018 1035    Alcohol Level:  Recent Labs  Lab 05/19/18 1035  ETH <10    Other results: EKG: normal EKG, normal sinus rhythm, unchanged from previous tracings.  Imaging: Dg Chest 2 View  Result Date: 05/19/2018 CLINICAL DATA:  Shortness of breath.  COPD exacerbation.  Smoker. EXAM: CHEST - 2 VIEW COMPARISON:  None. FINDINGS: Normal sized heart. Clear lungs. The lungs are hyperexpanded. Mildly elevated right hemidiaphragm. 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 Mild scoliosis. Diffuse osteopenia. Old, healed bilateral clavicle fractures. Old, healed bilateral rib fractures. Thoracic spine degenerative changes. There are also mild compression deformities of two of the upper  thoracic spine vertebrae with no acute fracture lines or bony retropulsion seen. Cervical spine degenerative changes. Bilateral carotid artery calcifications, greater on the right. IMPRESSION: 1. 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 If the patient has no abdominal symptoms, this does not require further evaluation. If there are abdominal symptoms, a two view radiographic examination of the abdomen would be recommended, including a left lateral decubitus view. 2. Mild changes of COPD. 3. Bilateral atheromatous carotid artery calcifications, greater on the right. Electronically Signed   By: Beckie Salts M.D.   On: 05/19/2018 10:29   Dg Abd 2 Views  Result Date: 05/19/2018 CLINICAL DATA:  Possible free air seen on prior chest x-ray. EXAM: ABDOMEN - 2 VIEW COMPARISON:  Chest radiograph 05/19/2018 FINDINGS: Diffuse sub pathologic gaseous distension of small bowel loops in the central abdomen. Preserved colonic bowel gas pattern. No evidence of free intra-abdominal air. IMPRESSION: Nonspecific nonobstructive bowel gas pattern with diffuse sub pathologic gaseous distension of small bowel loops. No evidence of free intra-abdominal air. Electronically Signed   By: Ted Mcalpine M.D.   On: 05/19/2018 11:57     Assessment/Plan:  62 y.o. male has a past medical history significant for COPD who presents with acute onset of generalized weakness and tremors with SOB. Elevated CK/dyhdration and hyponatremia   - Much improved this AM and tremors have significantly improved - Still hyponatremia - symptoms related likely to electrolyte abnormalities - No further imaging from Neuro stand point  05/20/2018, 9:52 AM

## 2018-05-20 NOTE — Progress Notes (Signed)
Patient ID: Jesus Turner, male   DOB: 1956-01-02, 62 y.o.   MRN: [id number]  [employer] PROGRESS NOTE  WELLS MABE WUJ:[id number] DOB: [birth year] DOA: 05/19/2018 PCP: [facility]  HPI/Subjective: Patient feeling a little bit better today.  He states he has not been eating very well.  He thinks he got hot he does not have air conditioning and has only one small fan.  Patient came in with weakness and shaking and found to have a very low sodium.  Objective: Vitals:   05/20/18 0741 05/20/18 0748  BP:    Pulse:    Resp:    Temp:    SpO2: 96% 97%    Filed Weights   05/19/18 0951 05/19/18 1350 05/20/18 0541  Weight: 63 kg (139 lb) 65.7 kg (144 lb 12.8 oz) 63.4 kg (139 lb 11.2 oz)    ROS: Review of Systems  Constitutional: Positive for malaise/fatigue. Negative for chills and fever.  Eyes: Negative for blurred vision.  Respiratory: Positive for shortness of breath. Negative for cough.   Cardiovascular: Negative for chest pain.  Gastrointestinal: Negative for abdominal pain, constipation, diarrhea, nausea and vomiting.  Genitourinary: Negative for dysuria.  Musculoskeletal: Negative for joint pain.  Neurological: Positive for weakness. Negative for dizziness and headaches.   Exam: Physical Exam  Constitutional: He is oriented to person, place, and time.  HENT:  Nose: No mucosal edema.  Mouth/Throat: No oropharyngeal exudate or posterior oropharyngeal edema.  Eyes: Pupils are equal, round, and reactive to light. Conjunctivae, EOM and lids are normal.  Neck: No JVD present. Carotid bruit is not present. No edema present. No thyroid mass and no thyromegaly present.  Cardiovascular: S1 normal and S2 normal. Exam reveals no gallop.  No murmur heard. Pulses:      Dorsalis pedis pulses are 2+ on the right side, and 2+ on the left side.  Respiratory: No respiratory distress. He has decreased breath sounds in the right lower field and the left lower field.  He has no wheezes. He has no rhonchi. He has no rales.  GI: Soft. Bowel sounds are normal. There is no tenderness.  Musculoskeletal:       Right ankle: He exhibits no swelling.       Left ankle: He exhibits no swelling.  Lymphadenopathy:    He has no cervical adenopathy.  Neurological: He is alert and oriented to person, place, and time. No cranial nerve deficit.  Skin: Skin is warm. No rash noted. Nails show no clubbing.  Psychiatric: He has a normal mood and affect.      Data Reviewed: Basic Metabolic Panel: Recent Labs  Lab 05/19/18 0953 05/19/18 1710 05/19/18 2058 05/19/18 2255 05/20/18 0119 05/20/18 0537  NA 111* 120* 123* 123* 123* 123*  K 3.1*  --   --   --  3.4*  --   CL 70*  --   --   --  85*  --   CO2 29  --   --   --  30  --   GLUCOSE 102*  --   --   --  120*  --   BUN <5*  --   --   --  5*  --   CREATININE 0.50*  --   --   --  0.55*  --   CALCIUM 8.5*  --   --   --  8.5*  --   MG 1.0*  --   --   --   --   --  Liver Function Tests: Recent Labs  Lab 05/20/18 0119  AST 57*  ALT 18  ALKPHOS 61  BILITOT 1.0  PROT 6.4*  ALBUMIN 3.5   CBC: Recent Labs  Lab 05/19/18 0953 05/20/18 0119  WBC 12.4* 4.1  HGB 13.8 13.1  HCT 37.8* 35.9*  MCV 91.8 92.5  PLT 250 210   Cardiac Enzymes: Recent Labs  Lab 05/19/18 0953 05/19/18 1035 05/20/18 0119  CKTOTAL 1,597*  --  1,131*  TROPONINI  --  <0.03  --      Studies: Dg Chest 2 View  Result Date: 05/19/2018 CLINICAL DATA:  Shortness of breath.  COPD exacerbation.  Smoker. EXAM: CHEST - 2 VIEW COMPARISON:  None. FINDINGS: Normal sized heart. Clear lungs. The lungs are hyperexpanded. Mildly elevated right hemidiaphragm. 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 Mild scoliosis. Diffuse osteopenia. Old, healed bilateral clavicle fractures. Old, healed bilateral rib fractures. Thoracic spine degenerative changes. There are also mild compression deformities of  two of the upper thoracic spine vertebrae with no acute fracture lines or bony retropulsion seen. Cervical spine degenerative changes. Bilateral carotid artery calcifications, greater on the right. IMPRESSION: 1. 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 If the patient has no abdominal symptoms, this does not require further evaluation. If there are abdominal symptoms, a two view radiographic examination of the abdomen would be recommended, including a left lateral decubitus view. 2. Mild changes of COPD. 3. Bilateral atheromatous carotid artery calcifications, greater on the right. Electronically Signed   By: Beckie Salts M.D.   On: 05/19/2018 10:29   Dg Abd 2 Views  Result Date: 05/19/2018 CLINICAL DATA:  Possible free air seen on prior chest x-ray. EXAM: ABDOMEN - 2 VIEW COMPARISON:  Chest radiograph 05/19/2018 FINDINGS: Diffuse sub pathologic gaseous distension of small bowel loops in the central abdomen. Preserved colonic bowel gas pattern. No evidence of free intra-abdominal air. IMPRESSION: Nonspecific nonobstructive bowel gas pattern with diffuse sub pathologic gaseous distension of small bowel loops. No evidence of free intra-abdominal air. Electronically Signed   By: Ted Mcalpine M.D.   On: 05/19/2018 11:57    Scheduled Meds: . citalopram  40 mg Oral Daily  . clonazePAM  1 mg Oral Q8H  . docusate sodium  100 mg Oral BID  . doxycycline  100 mg Oral Q12H  . famotidine  20 mg Oral BID  . feeding supplement (ENSURE ENLIVE)  237 mL Oral BID BM  . heparin  5,000 Units Subcutaneous Q8H  . ipratropium-albuterol  3 mL Nebulization TID  . lisinopril  20 mg Oral Daily  . propranolol  40 mg Oral BID   Continuous Infusions: . dextrose 5 % with KCl 20 mEq / L 20 mEq (05/19/18 2304)    Assessment/Plan:  1. Severe hyponatremia on presentation with a sodium of 111.  The patient was given normal saline and DDAVP.  The next sodium was up at 120 so  the patient was placed on D5W with KCl.  Repeat sodium is 123.  Continue this IV fluid during today.  Recheck sodium frequently. 2. Hypokalemia and hypomagnesemia.  Replace potassium and IV fluids.  Will replace magnesium again today. 3. Rhabdomyolysis.  Stop atorvastatin.  Continue IV fluids 4. Depression on Celexa 5. Essential hypertension on lisinopril and propranolol  Code Status:     Code Status Orders  (From admission, onward)        Start  Ordered   05/19/18 1344  Full code  Continuous     05/19/18 1343    Code Status History    This patient has a current code status but no historical code status.      Disposition Plan: To be determined based on serial sodiums  Consultants:  Nephrology  Neurology  Time spent: 28 minutes  Jaina Morin [employer]

## 2018-05-21 LAB — BASIC METABOLIC PANEL
ANION GAP: 6 (ref 5–15)
BUN: 9 mg/dL (ref 6–20)
CO2: 29 mmol/L (ref 22–32)
Calcium: 8 mg/dL — ABNORMAL LOW (ref 8.9–10.3)
Chloride: 86 mmol/L — ABNORMAL LOW (ref 101–111)
Creatinine, Ser: 0.55 mg/dL — ABNORMAL LOW (ref 0.61–1.24)
GFR calc Af Amer: >60 mL/min (ref >60)
GLUCOSE: 76 mg/dL (ref 65–99)
POTASSIUM: 3.2 mmol/L — AB (ref 3.5–5.1)
Sodium: 121 mmol/L — ABNORMAL LOW (ref 135–145)

## 2018-05-21 LAB — MAGNESIUM: Magnesium: 1.5 mg/dL — ABNORMAL LOW (ref 1.7–2.4)

## 2018-05-21 LAB — PHOSPHORUS: PHOSPHORUS: 2.3 mg/dL — AB (ref 2.5–4.6)

## 2018-05-21 LAB — PROTEIN ELECTRO, RANDOM URINE
ALPHA-2-GLOBULIN, U: 0 %
Albumin ELP, Urine: 0 %
Alpha-1-Globulin, U: 0 %
Beta Globulin, U: 0 %
Gamma Globulin, U: 0 %

## 2018-05-21 LAB — CK: CK TOTAL: 428 U/L — AB (ref 49–397)

## 2018-05-21 LAB — HIV ANTIBODY (ROUTINE TESTING W REFLEX): HIV SCREEN 4TH GENERATION: NONREACTIVE

## 2018-05-21 MED ORDER — POTASSIUM CHLORIDE IN NACL 20-0.9 MEQ/L-% IV SOLN
INTRAVENOUS | Status: DC
  Administered 2018-05-21 – 2018-05-22 (×2): via INTRAVENOUS
  Filled 2018-05-21 (×3): qty 1000

## 2018-05-21 MED ORDER — PREMIER PROTEIN SHAKE
11.0000 [oz_av] | Freq: Two times a day (BID) | ORAL | Status: DC
  Administered 2018-05-22 (×2): 11 [oz_av] via ORAL

## 2018-05-21 MED ORDER — K PHOS MONO-SOD PHOS DI & MONO 155-852-130 MG PO TABS
500.0000 mg | ORAL_TABLET | Freq: Three times a day (TID) | ORAL | Status: AC
  Administered 2018-05-21 – 2018-05-22 (×3): 500 mg via ORAL
  Filled 2018-05-21 (×3): qty 2

## 2018-05-21 MED ORDER — NICOTINE 21 MG/24HR TD PT24
21.0000 mg | MEDICATED_PATCH | Freq: Every day | TRANSDERMAL | Status: DC
  Administered 2018-05-21 – 2018-05-23 (×3): 21 mg via TRANSDERMAL
  Filled 2018-05-21 (×3): qty 1

## 2018-05-21 MED ORDER — MAGNESIUM SULFATE 2 GM/50ML IV SOLN
2.0000 g | Freq: Once | INTRAVENOUS | Status: AC
  Administered 2018-05-21: 08:00:00 2 g via INTRAVENOUS
  Filled 2018-05-21: qty 50

## 2018-05-21 MED ORDER — PROPRANOLOL HCL 20 MG PO TABS
20.0000 mg | ORAL_TABLET | Freq: Two times a day (BID) | ORAL | Status: DC
  Administered 2018-05-21 – 2018-05-23 (×4): 20 mg via ORAL
  Filled 2018-05-21 (×5): qty 1

## 2018-05-21 NOTE — Progress Notes (Signed)
[facility]  ROUNDING NOTE   Subjective:  Patient appears to be doing better. He has been restarted on 0.9 normal saline with potassium supplementation. Serum sodium currently 121.   Objective:  Vital signs in last 24 hours:  Temp:  [97.7 F (36.5 C)-97.8 F (36.6 C)] 97.8 F (36.6 C) (05/28 1216) Pulse Rate:  [56-70] 58 (05/28 1216) Resp:  [16-18] 18 (05/28 1216) BP: (122-142)/(77-95) 130/95 (05/28 1216) SpO2:  [90 %-96 %] 95 % (05/28 1336) Weight:  [64.8 kg (142 lb 14.4 oz)] 64.8 kg (142 lb 14.4 oz) (05/28 0426)  Weight change: 1.769 kg (3 lb 14.4 oz) Filed Weights   05/19/18 1350 05/20/18 0541 05/21/18 0426  Weight: 65.7 kg (144 lb 12.8 oz) 63.4 kg (139 lb 11.2 oz) 64.8 kg (142 lb 14.4 oz)    Intake/Output: I/O last 3 completed shifts: In: 1996.7 [P.O.:460; I.V.:1486.7; IV Piggyback:50] Out: 2500 [Urine:2500]   Intake/Output this shift:  Total I/O In: 240 [P.O.:240] Out: 1200 [Urine:1200]  Physical Exam: General: No acute distress  Head: Normocephalic, atraumatic. Moist oral mucosal membranes  Eyes: Anicteric  Neck: Supple, trachea midline  Lungs:  Clear to auscultation, normal effort  Heart: S1S2 no rubs  Abdomen:  Soft, nontender, bowel sounds present  Extremities: No peripheral edema.  Neurologic: Awake, alert, following commands  Skin: No lesions       Basic Metabolic Panel: Recent Labs  Lab 05/19/18 0953  05/19/18 2058 05/19/18 2255 05/20/18 0119 05/20/18 0537 05/21/18 0535  NA 111*   < > 123* 123* 123* 123* 121*  K 3.1*  --   --   --  3.4*  --  3.2*  CL 70*  --   --   --  85*  --  86*  CO2 29  --   --   --  30  --  29  GLUCOSE 102*  --   --   --  120*  --  76  BUN <5*  --   --   --  5*  --  9  CREATININE 0.50*  --   --   --  0.55*  --  0.55*  CALCIUM 8.5*  --   --   --  8.5*  --  8.0*  MG 1.0*  --   --   --   --   --  1.5*  PHOS  --   --   --   --   --   --  2.3*   < > = values in this interval not displayed.    Liver  Function Tests: Recent Labs  Lab 05/20/18 0119  AST 57*  ALT 18  ALKPHOS 61  BILITOT 1.0  PROT 6.4*  ALBUMIN 3.5   No results for input(s): LIPASE, AMYLASE in the last 168 hours. No results for input(s): AMMONIA in the last 168 hours.  CBC: Recent Labs  Lab 05/19/18 0953 05/20/18 0119  WBC 12.4* 4.1  HGB 13.8 13.1  HCT 37.8* 35.9*  MCV 91.8 92.5  PLT 250 210    Cardiac Enzymes: Recent Labs  Lab 05/19/18 0953 05/19/18 1035 05/20/18 0119 05/21/18 0535  CKTOTAL 1,597*  --  1,131* 428*  TROPONINI  --  <0.03  --   --     BNP: Invalid input(s): POCBNP  CBG: No results for input(s): GLUCAP in the last 168 hours.  Microbiology: No results found for this or any previous visit.  Coagulation Studies: No results for input(s): LABPROT, INR in the  last 72 hours.  Urinalysis: No results for input(s): COLORURINE, LABSPEC, PHURINE, GLUCOSEU, HGBUR, BILIRUBINUR, KETONESUR, PROTEINUR, UROBILINOGEN, NITRITE, LEUKOCYTESUR in the last 72 hours.  Invalid input(s): APPERANCEUR    Imaging: No results found.   Medications:   . 0.9 % NaCl with KCl 20 mEq / L 50 mL/hr at 05/21/18 0806   . citalopram  40 mg Oral Daily  . clonazePAM  1 mg Oral Q8H  . docusate sodium  100 mg Oral BID  . famotidine  20 mg Oral BID  . feeding supplement (ENSURE ENLIVE)  237 mL Oral BID BM  . heparin  5,000 Units Subcutaneous Q8H  . ipratropium-albuterol  3 mL Nebulization TID  . lisinopril  20 mg Oral Daily  . multivitamin with minerals  1 tablet Oral Daily  . propranolol  20 mg Oral BID   acetaminophen **OR** acetaminophen, bisacodyl, LORazepam, ondansetron **OR** ondansetron (ZOFRAN) IV  Assessment/ Plan:  62 y.o. male  male with a PMHX of COPD, HTN, was admitted on 05/19/2018 with confusion, tremors  and is found to have acute hyponatremia.   1.  Hyponatremia 2.  Hypokalemia 3.  Hypomagnesemia 4.  Rhabdomyolysis   Plan:  D5W has been discontinued.  Patient now on 0.9 normal  saline with potassium supplementation.  Serum sodium currently 121.  Target sodium is 129 within the next 24 hours.  Continue to monitor serum potassium as well.  We will encourage the patient to eat all 3 of his meals that he is administered in the hospital.  He verbalized understanding of this.  Further plan as patient progresses.   LOS: 2 Brion Hedges 5/28/20192:04 PM

## 2018-05-21 NOTE — Progress Notes (Signed)
Patient ID: Jesus Turner, male   DOB: [birth year], 62 y.o.   MRN: [id number]   [employer] PROGRESS NOTE  Jesus Turner DOB: 1956/07/04 Turner: 05/19/2018 PCP: [facility]  HPI/Subjective: Patient feeling better.  Asking when he can go home.  No complaints of shortness of breath.  Objective: Vitals:   05/21/18 1131 05/21/18 1216  BP:  (!) 130/95  Pulse: 60 (!) 58  Resp:  18  Temp:  97.8 F (36.6 C)  SpO2: 90% 94%    Filed Weights   05/19/18 1350 05/20/18 0541 05/21/18 0426  Weight: 65.7 kg (144 lb 12.8 oz) 63.4 kg (139 lb 11.2 oz) 64.8 kg (142 lb 14.4 oz)    ROS: Review of Systems  Constitutional: Negative for chills and fever.  Eyes: Negative for blurred vision.  Respiratory: Negative for cough and shortness of breath.   Cardiovascular: Negative for chest pain.  Gastrointestinal: Negative for abdominal pain, constipation, diarrhea, nausea and vomiting.  Genitourinary: Negative for dysuria.  Musculoskeletal: Negative for joint pain.  Neurological: Negative for dizziness, weakness and headaches.   Exam: Physical Exam  Constitutional: He is oriented to person, place, and time.  HENT:  Nose: No mucosal edema.  Mouth/Throat: No oropharyngeal exudate or posterior oropharyngeal edema.  Eyes: Pupils are equal, round, and reactive to light. Conjunctivae, EOM and lids are normal.  Neck: No JVD present. Carotid bruit is not present. No edema present. No thyroid mass and no thyromegaly present.  Cardiovascular: S1 normal and S2 normal. Exam reveals no gallop.  No murmur heard. Pulses:      Dorsalis pedis pulses are 2+ on the right side, and 2+ on the left side.  Respiratory: No respiratory distress. He has decreased breath sounds in the right lower field and the left lower field. He has no wheezes. He has no rhonchi. He has no rales.  GI: Soft. Bowel sounds are normal. There is no tenderness.  Musculoskeletal:       Right ankle: He  exhibits no swelling.       Left ankle: He exhibits no swelling.  Lymphadenopathy:    He has no cervical adenopathy.  Neurological: He is alert and oriented to person, place, and time. No cranial nerve deficit.  Skin: Skin is warm. No rash noted. Nails show no clubbing.  Psychiatric: He has a normal mood and affect.      Data Reviewed: Basic Metabolic Panel: Recent Labs  Lab 05/19/18 0953  05/19/18 2058 05/19/18 2255 05/20/18 0119 05/20/18 0537 05/21/18 0535  NA 111*   < > 123* 123* 123* 123* 121*  K 3.1*  --   --   --  3.4*  --  3.2*  CL 70*  --   --   --  85*  --  86*  CO2 29  --   --   --  30  --  29  GLUCOSE 102*  --   --   --  120*  --  76  BUN <5*  --   --   --  5*  --  9  CREATININE 0.50*  --   --   --  0.55*  --  0.55*  CALCIUM 8.5*  --   --   --  8.5*  --  8.0*  MG 1.0*  --   --   --   --   --  1.5*  PHOS  --   --   --   --   --   --  2.3*   < > = values in this interval not displayed.   Liver Function Tests: Recent Labs  Lab 05/20/18 0119  AST 57*  ALT 18  ALKPHOS 61  BILITOT 1.0  PROT 6.4*  ALBUMIN 3.5   CBC: Recent Labs  Lab 05/19/18 0953 05/20/18 0119  WBC 12.4* 4.1  HGB 13.8 13.1  HCT 37.8* 35.9*  MCV 91.8 92.5  PLT 250 210   Cardiac Enzymes: Recent Labs  Lab 05/19/18 0953 05/19/18 1035 05/20/18 0119 05/21/18 0535  CKTOTAL 1,597*  --  1,131* 428*  TROPONINI  --  <0.03  --   --      Scheduled Meds: . citalopram  40 mg Oral Daily  . clonazePAM  1 mg Oral Q8H  . docusate sodium  100 mg Oral BID  . doxycycline  100 mg Oral Q12H  . famotidine  20 mg Oral BID  . feeding supplement (ENSURE ENLIVE)  237 mL Oral BID BM  . heparin  5,000 Units Subcutaneous Q8H  . ipratropium-albuterol  3 mL Nebulization TID  . lisinopril  20 mg Oral Daily  . multivitamin with minerals  1 tablet Oral Daily  . propranolol  20 mg Oral BID   Continuous Infusions: . 0.9 % NaCl with KCl 20 mEq / L 50 mL/hr at 05/21/18 0806     Assessment/Plan:  1. Severe hyponatremia on presentation with a sodium of 111.  The patient was given normal saline and DDAVP.  The next sodium was up at 120 so the patient was placed on D5W with KCl.  Repeat sodium is 121.   changed IV fluids to normal saline with KCl. 2. Hypokalemia and hypomagnesemia.  Replace potassium in IV fluids.  Will replace magnesium again today. 3. Rhabdomyolysis.  Stop atorvastatin.  Continue IV fluids.  Check CPK tomorrow morning 4. Depression on Celexa 5. Essential hypertension on lisinopril and propranolol 6. Hypophosphatemia replace phosphorus also. 7. Relative bradycardia decrease Inderal dose down. 8. Discontinue Foley catheter.  Code Status:     Code Status Orders  (From admission, onward)        Start     Ordered   05/19/18 1344  Full code  Continuous     05/19/18 1343    Code Status History    This patient has a current code status but no historical code status.      Disposition Plan: To be determined based on serial sodiums  Consultants:  Nephrology  Neurology  Time spent: 27 minutes  Judythe Postema [employer]

## 2018-05-21 NOTE — Evaluation (Signed)
Physical Therapy Evaluation Patient Details Name: Jesus Turner MRN: [id number] DOB: Nov 18, 1956 Today's Date: 05/21/2018   History of Present Illness  Pt is a 62 y.o.malehas a past medical history significant forCOPD who presented with acute onset of weakness and tremors with SOB. No fever. Denied CP or palpitations. No change in bowels or bladder. Some vomiting but no diarrhea. In ER, sodium was very low and CK elevated c/w rhabdomyolysis. Denies ETOH abuse. Does smoke. He is now admitted.  Assessment includes: severe hyponatremia, hypokalemia, hypomagnesemia, rhabdomyolysis, depression, and essential HTN.      Clinical Impression  Pt presents with mild deficits in strength, transfers, mobility, gait, balance, and activity tolerance.  Pt was ind with bed mobility tasks and SBA with transfers with good control and stability. Pt's SpO2 on room air at rest 90% with HR 60 bpm, nursing notified.  After therex pt's SpO2 increased to 92% with HR 62 bpm.  After amb 100' pt's SpO2 increased to 95% with HR 69 bpm, nursing notified.  Pt with very mild instability with static and dynamic standing without UE support but improved with use of the RW.  Pt will benefit from HHPT services upon discharge to safely address above deficits for decreased risk of further falls and further functional decline.        Follow Up Recommendations Home health PT    Equipment Recommendations  None recommended by PT    Recommendations for Other Services       Precautions / Restrictions Precautions Precautions: Fall Restrictions Weight Bearing Restrictions: No      Mobility  Bed Mobility Overal bed mobility: Independent                Transfers Overall transfer level: Needs assistance Equipment used: Rolling walker (2 wheeled) Transfers: Sit to/from Stand Sit to Stand: Supervision         General transfer comment: Good control and stability with transfers  Ambulation/Gait Ambulation/Gait  assistance: Supervision Ambulation Distance (Feet): 100 Feet Assistive device: Rolling walker (2 wheeled) Gait Pattern/deviations: Step-through pattern;Decreased step length - right;Decreased step length - left;Trunk flexed Gait velocity: Decreased   General Gait Details: Mod verbal cues for amb closer to RW with upright posture with pt ambulating with slow cadence and short B step length but steady without LOB  Stairs Stairs: (Deferred)          Wheelchair Mobility    Modified Rankin (Stroke Patients Only)       Balance Overall balance assessment: Mild deficits observed, not formally tested                                           Pertinent Vitals/Pain Pain Assessment: No/denies pain    Home Living Family/patient expects to be discharged to:: Private residence Living Arrangements: Alone Available Help at Discharge: Family;Available PRN/intermittently(Mother lives 100 feet away) Type of Home: Other(Comment)(Pt lives in a camper) Home Access: Stairs to enter Entrance Stairs-Rails: Left(Post on the L to hold) Secretary/administrator of Steps: 1 Home Layout: One level Home Equipment: Environmental consultant - 4 wheels;Walker - 2 wheels      Prior Function Level of Independence: Independent         Comments: Ind amb without AD, Ind with ADLs, 2 falls in the last year including one recently secondary to "just lost my balance".     Hand Dominance  Extremity/Trunk Assessment   Upper Extremity Assessment Upper Extremity Assessment: Overall WFL for tasks assessed    Lower Extremity Assessment Lower Extremity Assessment: Generalized weakness       Communication   Communication: No difficulties  Cognition Arousal/Alertness: Awake/alert Behavior During Therapy: WFL for tasks assessed/performed Overall Cognitive Status: Within Functional Limits for tasks assessed                                        General Comments       Exercises Total Joint Exercises Ankle Circles/Pumps: AROM;Both;10 reps Quad Sets: Strengthening;Both;10 reps Gluteal Sets: Strengthening;Both;10 reps Heel Slides: AROM;Both;5 reps Hip ABduction/ADduction: AROM;Both;5 reps Long Arc Quad: AROM;Both;10 reps Knee Flexion: AROM;Both;10 reps Marching in Standing: AROM;Both;10 reps;Seated Other Exercises Other Exercises: HEP education for BLE APs, QS, and GS x 10 each 5-6x/day   Assessment/Plan    PT Assessment Patient needs continued PT services  PT Problem List Decreased strength;Decreased activity tolerance;Decreased balance;Decreased knowledge of use of DME       PT Treatment Interventions DME instruction;Gait training;Stair training;Balance training;Therapeutic exercise;Therapeutic activities;Patient/family education    PT Goals (Current goals can be found in the Care Plan section)  Acute Rehab PT Goals Patient Stated Goal: Improved balance and strength PT Goal Formulation: With patient Time For Goal Achievement: 06/03/18 Potential to Achieve Goals: Good    Frequency Min 2X/week   Barriers to discharge        Co-evaluation               AM-PAC PT "6 Clicks" Daily Activity  Outcome Measure Difficulty turning over in bed (including adjusting bedclothes, sheets and blankets)?: None Difficulty moving from lying on back to sitting on the side of the bed? : None Difficulty sitting down on and standing up from a chair with arms (e.g., wheelchair, bedside commode, etc,.)?: None Help needed moving to and from a bed to chair (including a wheelchair)?: None Help needed walking in hospital room?: None Help needed climbing 3-5 steps with a railing? : A Little 6 Click Score: 23    End of Session Equipment Utilized During Treatment: Gait belt Activity Tolerance: Patient tolerated treatment well Patient left: in bed;with call bell/phone within reach;with bed alarm set Nurse Communication: Mobility status;Other (comment)(Pt's  SpO2 90% at rest on room air increasing to 95% with amb) PT Visit Diagnosis: History of falling (Z91.81);Muscle weakness (generalized) (M62.81);Difficulty in walking, not elsewhere classified (R26.2)    Time: 0930-1002 PT Time Calculation (min) (ACUTE ONLY): 32 min   Charges:   PT Evaluation $PT Eval Low Complexity: 1 Low PT Treatments $Therapeutic Exercise: 8-22 mins   PT G Codes:        DElly Modena PT, DPT 05/21/18, 11:44 AM

## 2018-05-21 NOTE — Plan of Care (Signed)
Neuro checks WDL.  Last Sodium 121. Foley removed, pt voided without difficulty. HR in the 50's. Dr Renae Gloss notified, Inderal dose modified per order.

## 2018-05-21 NOTE — Progress Notes (Signed)
Nutrition Brief Note  RD notified that patient does not like Ensure Enlive. Full assessment was completed on 05/20/2018.  Will discontinue Ensure Enlive.  Provide Premier Protein po BID, each supplement provides 160 kcal and 30 grams of protein.  Continue daily MVI.  Continue monitoring potassium, phosphorus, and magnesium and replacing as needed as patient is at risk for refeeding syndrome.  Helane Rima, MS, RD, LDN Office: [phone] Pager: [phone] After Hours/Weekend Pager: [phone]

## 2018-05-22 LAB — POTASSIUM: Potassium: 3.1 mmol/L — ABNORMAL LOW (ref 3.5–5.1)

## 2018-05-22 LAB — SODIUM: Sodium: 130 mmol/L — ABNORMAL LOW (ref 135–145)

## 2018-05-22 LAB — MAGNESIUM: Magnesium: 1.9 mg/dL (ref 1.7–2.4)

## 2018-05-22 LAB — CK: CK TOTAL: 229 U/L (ref 49–397)

## 2018-05-22 MED ORDER — LORAZEPAM 2 MG/ML IJ SOLN
0.5000 mg | Freq: Two times a day (BID) | INTRAMUSCULAR | Status: DC | PRN
  Administered 2018-05-22: 0.5 mg via INTRAVENOUS
  Filled 2018-05-22: qty 1

## 2018-05-22 MED ORDER — LACTULOSE 10 GM/15ML PO SOLN
30.0000 g | Freq: Two times a day (BID) | ORAL | Status: AC
  Administered 2018-05-22: 13:00:00 30 g via ORAL
  Filled 2018-05-22 (×2): qty 60

## 2018-05-22 MED ORDER — POTASSIUM CHLORIDE 20 MEQ PO PACK
40.0000 meq | PACK | Freq: Once | ORAL | Status: AC
  Administered 2018-05-22: 09:00:00 40 meq via ORAL
  Filled 2018-05-22: qty 2

## 2018-05-22 MED ORDER — IPRATROPIUM-ALBUTEROL 0.5-2.5 (3) MG/3ML IN SOLN
3.0000 mL | Freq: Two times a day (BID) | RESPIRATORY_TRACT | Status: DC
Start: 2018-05-22 — End: 2018-05-23
  Administered 2018-05-22 – 2018-05-23 (×2): 3 mL via RESPIRATORY_TRACT
  Filled 2018-05-22 (×2): qty 3

## 2018-05-22 MED ORDER — POTASSIUM CHLORIDE 10 MEQ/100ML IV SOLN
10.0000 meq | INTRAVENOUS | Status: AC
  Administered 2018-05-22 (×4): 10 meq via INTRAVENOUS
  Filled 2018-05-22 (×4): qty 100

## 2018-05-22 NOTE — Progress Notes (Signed)
[facility]  ROUNDING NOTE   Subjective:  Sodium significantly improved today to 130. Patient appears to be in good spirits. He appears to be having better p.o. intake as well.   Objective:  Vital signs in last 24 hours:  Temp:  [97.6 F (36.4 C)-98.1 F (36.7 C)] 98.1 F (36.7 C) (05/29 1201) Pulse Rate:  [58-77] 64 (05/29 1201) Resp:  [17-22] 22 (05/29 1201) BP: (112-171)/(79-96) 137/90 (05/29 1201) SpO2:  [92 %-98 %] 98 % (05/29 1201) Weight:  [64.6 kg (142 lb 8 oz)] 64.6 kg (142 lb 8 oz) (05/29 0347)  Weight change: -0.181 kg (-6.4 oz) Filed Weights   05/20/18 0541 05/21/18 0426 05/22/18 0347  Weight: 63.4 kg (139 lb 11.2 oz) 64.8 kg (142 lb 14.4 oz) 64.6 kg (142 lb 8 oz)    Intake/Output: I/O last 3 completed shifts: In: 2065 [P.O.:480; I.V.:1585] Out: 3475 [Urine:3475]   Intake/Output this shift:  Total I/O In: -  Out: 950 [Urine:950]  Physical Exam: General: No acute distress  Head: Normocephalic, atraumatic. Moist oral mucosal membranes  Eyes: Anicteric  Neck: Supple, trachea midline  Lungs:  Clear to auscultation, normal effort  Heart: S1S2 no rubs  Abdomen:  Soft, nontender, bowel sounds present  Extremities: No peripheral edema.  Neurologic: Awake, alert, following commands  Skin: No lesions       Basic Metabolic Panel: Recent Labs  Lab 05/19/18 0953  05/19/18 2255 05/20/18 0119 05/20/18 0537 05/21/18 0535 05/22/18 0343  NA 111*   < > 123* 123* 123* 121* 130*  K 3.1*  --   --  3.4*  --  3.2* 3.1*  CL 70*  --   --  85*  --  86*  --   CO2 29  --   --  30  --  29  --   GLUCOSE 102*  --   --  120*  --  76  --   BUN <5*  --   --  5*  --  9  --   CREATININE 0.50*  --   --  0.55*  --  0.55*  --   CALCIUM 8.5*  --   --  8.5*  --  8.0*  --   MG 1.0*  --   --   --   --  1.5* 1.9  PHOS  --   --   --   --   --  2.3*  --    < > = values in this interval not displayed.    Liver Function Tests: Recent Labs  Lab 05/20/18 0119  AST  57*  ALT 18  ALKPHOS 61  BILITOT 1.0  PROT 6.4*  ALBUMIN 3.5   No results for input(s): LIPASE, AMYLASE in the last 168 hours. No results for input(s): AMMONIA in the last 168 hours.  CBC: Recent Labs  Lab 05/19/18 0953 05/20/18 0119  WBC 12.4* 4.1  HGB 13.8 13.1  HCT 37.8* 35.9*  MCV 91.8 92.5  PLT 250 210    Cardiac Enzymes: Recent Labs  Lab 05/19/18 0953 05/19/18 1035 05/20/18 0119 05/21/18 0535 05/22/18 0343  CKTOTAL 1,597*  --  1,131* 428* 229  TROPONINI  --  <0.03  --   --   --     BNP: Invalid input(s): POCBNP  CBG: No results for input(s): GLUCAP in the last 168 hours.  Microbiology: No results found for this or any previous visit.  Coagulation Studies: No results for input(s): LABPROT, INR in the  last 72 hours.  Urinalysis: No results for input(s): COLORURINE, LABSPEC, PHURINE, GLUCOSEU, HGBUR, BILIRUBINUR, KETONESUR, PROTEINUR, UROBILINOGEN, NITRITE, LEUKOCYTESUR in the last 72 hours.  Invalid input(s): APPERANCEUR    Imaging: No results found.   Medications:   . 0.9 % NaCl with KCl 20 mEq / L 30 mL/hr at 05/22/18 1122   . citalopram  40 mg Oral Daily  . clonazePAM  1 mg Oral Q8H  . docusate sodium  100 mg Oral BID  . famotidine  20 mg Oral BID  . heparin  5,000 Units Subcutaneous Q8H  . ipratropium-albuterol  3 mL Nebulization BID  . lactulose  30 g Oral BID  . lisinopril  20 mg Oral Daily  . multivitamin with minerals  1 tablet Oral Daily  . nicotine  21 mg Transdermal Daily  . propranolol  20 mg Oral BID  . protein supplement shake  11 oz Oral BID BM   acetaminophen **OR** acetaminophen, bisacodyl, LORazepam, ondansetron **OR** ondansetron (ZOFRAN) IV  Assessment/ Plan:  62 y.o. male  male with a PMHX of COPD, HTN, was admitted on 05/19/2018 with confusion, tremors  and is found to have acute hyponatremia.   1.  Hyponatremia 2.  Hypokalemia 3.  Hypomagnesemia 4.  Rhabdomyolysis   Plan:  We will maintain the patient on  0.9 normal saline with potassium supplementation.  In addition since his potassium remains low we will give him potassium chloride 40 mEq IV today.  With the patient's rhabdomyolysis is also corrected as today CK was 229.  Renal function has been normal.  Further plan as patient progresses.   LOS: 3 Daymien Goth 5/29/201912:08 PM

## 2018-05-22 NOTE — Progress Notes (Signed)
[facility]   PATIENT NAME: Jesus Turner    MR#:  [id number]  DATE OF BIRTH:  08/28/1956  SUBJECTIVE:  CHIEF COMPLAINT:   Chief Complaint  Patient presents with  . COPD  . Leg Pain   Patient complains of constipation, patient states that he does not feel like going home today due to mobility issues, no events overnight per nursing staff, physical therapy input appreciated REVIEW OF SYSTEMS:  CONSTITUTIONAL: No fever, fatigue or weakness.  EYES: No blurred or double vision.  EARS, NOSE, AND THROAT: No tinnitus or ear pain.  RESPIRATORY: No cough, shortness of breath, wheezing or hemoptysis.  CARDIOVASCULAR: No chest pain, orthopnea, edema.  GASTROINTESTINAL: No nausea, vomiting, diarrhea or abdominal pain.  GENITOURINARY: No dysuria, hematuria.  ENDOCRINE: No polyuria, nocturia,  HEMATOLOGY: No anemia, easy bruising or bleeding SKIN: No rash or lesion. MUSCULOSKELETAL: No joint pain or arthritis.   NEUROLOGIC: No tingling, numbness, weakness.  PSYCHIATRY: No anxiety or depression.   ROS  DRUG ALLERGIES:  No Known Allergies  VITALS:  Blood pressure (!) 171/95, pulse 63, temperature 97.8 F (36.6 C), temperature source Oral, resp. rate 18, height  (1.854 m), weight 64.6 kg (142 lb 8 oz), SpO2 94 %.  PHYSICAL EXAMINATION:  GENERAL:  62 y.o.-year-old patient lying in the bed with no acute distress.  EYES: Pupils equal, round, reactive to light and accommodation. No scleral icterus. Extraocular muscles intact.  HEENT: Head atraumatic, normocephalic. Oropharynx and nasopharynx clear.  NECK:  Supple, no jugular venous distention. No thyroid enlargement, no tenderness.  LUNGS: Normal breath sounds bilaterally, no wheezing, rales,rhonchi or crepitation. No use of accessory muscles of respiration.  CARDIOVASCULAR: S1, S2 normal. No murmurs, rubs, or gallops.  ABDOMEN: Soft, nontender, nondistended. Bowel sounds present. No organomegaly  or mass.  EXTREMITIES: No pedal edema, cyanosis, or clubbing.  NEUROLOGIC: Cranial nerves II through XII are intact. Muscle strength 5/5 in all extremities. Sensation intact. Gait not checked.  PSYCHIATRIC: The patient is alert and oriented x 3.  SKIN: No obvious rash, lesion, or ulcer.   Physical Exam LABORATORY PANEL:   CBC Recent Labs  Lab 05/20/18 0119  WBC 4.1  HGB 13.1  HCT 35.9*  PLT 210   ------------------------------------------------------------------------------------------------------------------  Chemistries  Recent Labs  Lab 05/20/18 0119  05/21/18 0535 05/22/18 0343  NA 123*   < > 121* 130*  K 3.4*  --  3.2* 3.1*  CL 85*  --  86*  --   CO2 30  --  29  --   GLUCOSE 120*  --  76  --   BUN 5*  --  9  --   CREATININE 0.55*  --  0.55*  --   CALCIUM 8.5*  --  8.0*  --   MG  --   --  1.5* 1.9  AST 57*  --   --   --   ALT 18  --   --   --   ALKPHOS 61  --   --   --   BILITOT 1.0  --   --   --    < > = values in this interval not displayed.   ------------------------------------------------------------------------------------------------------------------  Cardiac Enzymes Recent Labs  Lab 05/19/18 1035  TROPONINI <0.03   ------------------------------------------------------------------------------------------------------------------  RADIOLOGY:  No results found.  ASSESSMENT AND PLAN:  *Acute severe hyponatremia Much improved sodium of 111 on admission Continue IV fluids for rehydration, status post DDAVP, BMP in the morning  *  Acute hypokalemia/hypomagnesemia Repleted  *Acute rhabdomyolysis Resolved Statin therapy was discontinued  *Chronic depression Stable on Celexa  *Chronic benign essential hypertension Stable on current regiment  *Acute bradycardia Resolved Inderal decreased  *Acute constipation Lactulose twice daily for now  Disposition Home in 1 to 2 days once patient feels up to it with home health PT, discontinue  telemetry  All the records are reviewed and case discussed with Care Management/Social Workerr. Management plans discussed with the patient, family and they are in agreement.  CODE STATUS: full  TOTAL TIME TAKING CARE OF THIS PATIENT: 45 minutes.     POSSIBLE D/C IN 1-2 DAYS, DEPENDING ON CLINICAL CONDITION.   Evelena Asa Salary M.D on 05/22/2018   Between 7am to 6pm - Pager - [phone]  After 6pm go to [email or web address] - Social research officer, government  [facility]  Office  [phone]  CC: Primary care physician; Center, [facility]  Note: This dictation was prepared with Dragon dictation along with smaller phrase technology. Any transcriptional errors that result from this process are unintentional.

## 2018-05-22 NOTE — Care Management (Signed)
Sodium - 130 today. Physical therapy evaluation completed. Recommending home with home health/physical therapy. Information on [facility] clinic given Possible discharge to home tomorrow. Discussed his mother being able to transport home when discharged Gwenette Greet RN MSN CCM Care Management [phone]

## 2018-05-23 LAB — BASIC METABOLIC PANEL
Anion gap: 8 (ref 5–15)
BUN: 6 mg/dL (ref 6–20)
CALCIUM: 9 mg/dL (ref 8.9–10.3)
CHLORIDE: 95 mmol/L — AB (ref 101–111)
CO2: 28 mmol/L (ref 22–32)
CREATININE: 0.48 mg/dL — AB (ref 0.61–1.24)
GFR calc Af Amer: >60 mL/min (ref >60)
GFR calc non Af Amer: >60 mL/min (ref >60)
Glucose, Bld: 83 mg/dL (ref 65–99)
Potassium: 4.2 mmol/L (ref 3.5–5.1)
Sodium: 131 mmol/L — ABNORMAL LOW (ref 135–145)

## 2018-05-23 LAB — CBC
HCT: 40.7 % (ref 40.0–52.0)
Hemoglobin: 14.1 g/dL (ref 13.0–18.0)
MCH: 33.5 pg (ref 26.0–34.0)
MCHC: 34.7 g/dL (ref 32.0–36.0)
MCV: 96.5 fL (ref 80.0–100.0)
PLATELETS: 225 10*3/uL (ref 150–440)
RBC: 4.22 MIL/uL — ABNORMAL LOW (ref 4.40–5.90)
RDW: 13.9 % (ref 11.5–14.5)
WBC: 7.4 10*3/uL (ref 3.8–10.6)

## 2018-05-23 MED ORDER — ADULT MULTIVITAMIN W/MINERALS CH: 1.0000 | ORAL_TABLET | Freq: Every day | ORAL | 0 refills | Status: DC

## 2018-05-23 MED ORDER — PREMIER PROTEIN SHAKE: 11.0000 [oz_av] | Freq: Two times a day (BID) | ORAL | 0 refills | Status: DC

## 2018-05-23 MED ORDER — DOCUSATE SODIUM 100 MG PO CAPS: 100.0000 mg | ORAL_CAPSULE | Freq: Every day | ORAL | 0 refills | Status: DC | PRN

## 2018-05-23 MED ORDER — PROPRANOLOL HCL 20 MG PO TABS: 20.0000 mg | ORAL_TABLET | Freq: Two times a day (BID) | ORAL | 0 refills | Status: DC

## 2018-05-23 MED ORDER — ATORVASTATIN CALCIUM 20 MG PO TABS: 20.0000 mg | ORAL_TABLET | Freq: Every day | ORAL | 0 refills | Status: DC

## 2018-05-23 MED ORDER — NICOTINE 21 MG/24HR TD PT24: 21.0000 mg | MEDICATED_PATCH | Freq: Every day | TRANSDERMAL | 0 refills | Status: DC

## 2018-05-23 NOTE — Care Management (Signed)
Patient qualifies for  [facility] home health with [facility].  He declines physical therapy.  Agreeable to nurse and CSW.

## 2018-05-23 NOTE — Progress Notes (Signed)
Patient discharged home per MD orders. Prescriptions given to patient. All discharge instructions given to patient and mother and all questions answered.

## 2018-05-23 NOTE — Progress Notes (Signed)
Physical Therapy Treatment Patient Details Name: Jesus Turner MRN: [id number] DOB: 08-03-1956 Today's Date: 05/23/2018    History of Present Illness Pt is a 62 y.o.malehas a past medical history significant forCOPD who presented with acute onset of weakness and tremors with SOB. No fever. Denied CP or palpitations. No change in bowels or bladder. Some vomiting but no diarrhea. In ER, sodium was very low and CK elevated c/w rhabdomyolysis. Denies ETOH abuse. Does smoke. He is now admitted.  Assessment includes: severe hyponatremia, hypokalemia, hypomagnesemia, rhabdomyolysis, depression, and essential HTN.      PT Comments    Pt excited for therapy this morning "Let's go".  Bed mobility and transfers without difficulty.  Stated he used a 4 wheeled rollator walker at times at home.  Does not have a rolling walker at home.   Was able to ambulate around unit with walker and supervision without LOB or bucking.  He requests to try gait without RW and was able to walk 40' without device with short shuffling steps and reported feeling unsteady.    Pt states he lives in a camper in back of his mothers house.  Is unable to use AD in home as everything is too close but feels comfortable as he can reach things on both sides of him for support/balance as needed.  Encouraged to use walker at all times when walking outside or in community.  Pt is interested in a RW but is concerned about cost and insurance.     Follow Up Recommendations  Home health PT     Equipment Recommendations  Rolling walker with 5" wheels;Other (comment)    Recommendations for Other Services       Precautions / Restrictions Precautions Precautions: Fall Restrictions Weight Bearing Restrictions: No    Mobility  Bed Mobility Overal bed mobility: Independent                Transfers Overall transfer level: Modified independent Equipment used: Rolling walker (2 wheeled) Transfers: Sit to/from Stand Sit to  Stand: Supervision            Ambulation/Gait Ambulation/Gait assistance: Supervision Ambulation Distance (Feet): 200 Feet Assistive device: Rolling walker (2 wheeled) Gait Pattern/deviations: Step-through pattern;Decreased step length - right;Decreased step length - left;Narrow base of support   Gait velocity interpretation: <1.8 ft/sec, indicate of risk for recurrent falls     Stairs             Wheelchair Mobility    Modified Rankin (Stroke Patients Only)       Balance Overall balance assessment: Mild deficits observed, not formally tested                                          Cognition Arousal/Alertness: Awake/alert Behavior During Therapy: WFL for tasks assessed/performed Overall Cognitive Status: Within Functional Limits for tasks assessed                                        Exercises      General Comments        Pertinent Vitals/Pain Pain Assessment: No/denies pain    Home Living                      Prior Function  PT Goals (current goals can now be found in the care plan section) Progress towards PT goals: Progressing toward goals    Frequency    Min 2X/week      PT Plan Current plan remains appropriate    Co-evaluation              AM-PAC PT "6 Clicks" Daily Activity  Outcome Measure  Difficulty turning over in bed (including adjusting bedclothes, sheets and blankets)?: None Difficulty moving from lying on back to sitting on the side of the bed? : None Difficulty sitting down on and standing up from a chair with arms (e.g., wheelchair, bedside commode, etc,.)?: None Help needed moving to and from a bed to chair (including a wheelchair)?: None Help needed walking in hospital room?: None Help needed climbing 3-5 steps with a railing? : A Little 6 Click Score: 23    End of Session Equipment Utilized During Treatment: Gait belt Activity Tolerance: Patient  tolerated treatment well Patient left: in bed;with call bell/phone within reach;with bed alarm set         Time: 1610-9604 PT Time Calculation (min) (ACUTE ONLY): 14 min  Charges:  $Gait Training: 8-22 mins                    G Codes:       Danielle Dess, PTA 05/23/18, 9:17 AM

## 2018-05-23 NOTE — Discharge Summary (Addendum)
[facility] [place] at [facility]   PATIENT NAME: Jesus Turner    MR#:  [id number]  DATE OF BIRTH:  12-03-1956  DATE OF ADMISSION:  05/19/2018 ADMITTING PHYSICIAN: Marguarite Arbour, MD  DATE OF DISCHARGE: No discharge date for patient encounter.  PRIMARY CARE PHYSICIAN: Center, [facility]    ADMISSION DIAGNOSIS:  Hyponatremia [E87.1] Muscle spasms of both lower extremities [M62.838]  DISCHARGE DIAGNOSIS:  Principal Problem:   Hyponatremia Active Problems:   Tremor   COPD (chronic obstructive pulmonary disease) (HCC)   HTN (hypertension)   Malnutrition of moderate degree   SECONDARY DIAGNOSIS:  History reviewed. No pertinent past medical history.  HOSPITAL COURSE:  *Acute severe hyponatremia Most likely secondary to alcohol abuse Much improved sodium of 111 on admission, 131 on discharge Treated with IV fluids for rehydration, status post DDAVP, nephrology did see patient while in house  *Acute hypokalemia/hypomagnesemia Repleted  *Acute rhabdomyolysis Resolved Statin therapy was discontinued  *Chronic depression Stable on Celexa  *Chronic benign essential hypertension Stable on current regiment  *Acute bradycardia Resolved Inderal decreased  *Acute constipation Resolved with lactulose  DISCHARGE CONDITIONS:   Stable CONSULTS OBTAINED:  Treatment Team:  Mosetta Pigeon, MD Pauletta Browns, MD Fender Herder, Evelena Asa, MD  DRUG ALLERGIES:  No Known Allergies  DISCHARGE MEDICATIONS:   Allergies as of 05/23/2018   No Known Allergies     Medication List    STOP taking these medications   metoprolol tartrate 50 MG tablet Commonly known as:  LOPRESSOR     TAKE these medications   atorvastatin 20 MG tablet Commonly known as:  LIPITOR Take 1 tablet (20 mg total) by mouth at bedtime.   citalopram 40 MG tablet Commonly known as:  CELEXA Take 1 tablet by mouth daily.   docusate sodium 100 MG  capsule Commonly known as:  COLACE Take 1 capsule (100 mg total) by mouth daily as needed for mild constipation.   hydrochlorothiazide 25 MG tablet Commonly known as:  HYDRODIURIL Take 25 mg by mouth daily.   lisinopril 20 MG tablet Commonly known as:  PRINIVIL,ZESTRIL Take 20 mg by mouth daily.   multivitamin with minerals Tabs tablet Take 1 tablet by mouth daily. Start taking on:  05/24/2018   nicotine 21 mg/24hr patch Commonly known as:  NICODERM CQ - dosed in mg/24 hours Place 1 patch (21 mg total) onto the skin daily. Start taking on:  05/24/2018   propranolol 20 MG tablet Commonly known as:  INDERAL Take 1 tablet (20 mg total) by mouth 2 (two) times daily.   protein supplement shake Liqd Commonly known as:  PREMIER PROTEIN Take 325 mLs (11 oz total) by mouth 2 (two) times daily between meals.        DISCHARGE INSTRUCTIONS:   If you experience worsening of your admission symptoms, develop shortness of breath, life threatening emergency, suicidal or homicidal thoughts you must seek medical attention immediately by calling 911 or calling your MD immediately  if symptoms less severe.  You Must read complete instructions/literature along with all the possible adverse reactions/side effects for all the Medicines you take and that have been prescribed to you. Take any new Medicines after you have completely understood and accept all the possible adverse reactions/side effects.   Please note  You were cared for by a hospitalist during your hospital stay. If you have any questions about your discharge medications or the care you received while you were in the hospital after you are discharged,  you can call the unit and asked to speak with the hospitalist on call if the hospitalist that took care of you is not available. Once you are discharged, your primary care physician will handle any further medical issues. Please note that NO REFILLS for any discharge medications will be  authorized once you are discharged, as it is imperative that you return to your primary care physician (or establish a relationship with a primary care physician if you do not have one) for your aftercare needs so that they can reassess your need for medications and monitor your lab values.    Today   CHIEF COMPLAINT:   Chief Complaint  Patient presents with  . COPD  . Leg Pain    HISTORY OF PRESENT ILLNESS:  62 y.o. male has a past medical history significant for COPD who presents with acute onset(24h) of weakness and tremors with SOB. No fever. Deneis CP or palpitations. No change in bowels or bladder. Some vomiting but no diarrhea. In ER, sodium very low and CK elevated c/w rhabdomyolysis. Denies ETOH abuse. Does smoke. He is now admitted     VITAL SIGNS:  Blood pressure (!) 174/83, pulse 61, temperature (!) 97.4 F (36.3 C), temperature source Oral, resp. rate 16, height  (1.854 m), weight 62.7 kg (138 lb 3.7 oz), SpO2 97 %.  I/O:    Intake/Output Summary (Last 24 hours) at 05/23/2018 1129 Last data filed at 05/23/2018 0900 Gross per 24 hour  Intake 480 ml  Output 2200 ml  Net -1720 ml    PHYSICAL EXAMINATION:  GENERAL:  62 y.o.-year-old patient lying in the bed with no acute distress.  EYES: Pupils equal, round, reactive to light and accommodation. No scleral icterus. Extraocular muscles intact.  HEENT: Head atraumatic, normocephalic. Oropharynx and nasopharynx clear.  NECK:  Supple, no jugular venous distention. No thyroid enlargement, no tenderness.  LUNGS: Normal breath sounds bilaterally, no wheezing, rales,rhonchi or crepitation. No use of accessory muscles of respiration.  CARDIOVASCULAR: S1, S2 normal. No murmurs, rubs, or gallops.  ABDOMEN: Soft, non-tender, non-distended. Bowel sounds present. No organomegaly or mass.  EXTREMITIES: No pedal edema, cyanosis, or clubbing.  NEUROLOGIC: Cranial nerves II through XII are intact. Muscle strength 5/5 in all  extremities. Sensation intact. Gait not checked.  PSYCHIATRIC: The patient is alert and oriented x 3.  SKIN: No obvious rash, lesion, or ulcer.   DATA REVIEW:   CBC Recent Labs  Lab 05/23/18 0507  WBC 7.4  HGB 14.1  HCT 40.7  PLT 225    Chemistries  Recent Labs  Lab 05/20/18 0119  05/22/18 0343 05/23/18 0511  NA 123*   < > 130* 131*  K 3.4*   < > 3.1* 4.2  CL 85*   < >  --  95*  CO2 30   < >  --  28  GLUCOSE 120*   < >  --  83  BUN 5*   < >  --  6  CREATININE 0.55*   < >  --  0.48*  CALCIUM 8.5*   < >  --  9.0  MG  --    < > 1.9  --   AST 57*  --   --   --   ALT 18  --   --   --   ALKPHOS 61  --   --   --   BILITOT 1.0  --   --   --    < > =  values in this interval not displayed.    Cardiac Enzymes Recent Labs  Lab 05/19/18 1035  TROPONINI <0.03    Microbiology Results  No results found for this or any previous visit.  RADIOLOGY:  No results found.  EKG:   Orders placed or performed during the hospital encounter of 05/19/18  . ED EKG  . ED EKG      Management plans discussed with the patient, family and they are in agreement.  CODE STATUS:     Code Status Orders  (From admission, onward)        Start     Ordered   05/19/18 1344  Full code  Continuous     05/19/18 1343    Code Status History    This patient has a current code status but no historical code status.      TOTAL TIME TAKING CARE OF THIS PATIENT: 45 minutes.    Evelena Asa Tesha Archambeau M.D on 05/23/2018 at 11:29 AM  Between 7am to 6pm - Pager - [phone]  After 6pm go to [email or web address] - Social research officer, government  [facility] [place] [facility]  Office  [phone]  CC: Primary care physician; Center, [facility]   Note: This dictation was prepared with Dragon dictation along with smaller phrase technology. Any transcriptional errors that result from this process are unintentional.

## 2018-05-23 NOTE — Care Management (Signed)
Reaching out to [facility] to determine if would qualify for home health under charity program.

## 2018-05-24 LAB — PROTEIN ELECTROPHORESIS, SERUM
A/G Ratio: 1.4 (ref 0.7–1.7)
ALBUMIN ELP: 3.3 g/dL (ref 2.9–4.4)
ALPHA-1-GLOBULIN: 0.2 g/dL (ref 0.0–0.4)
Alpha-2-Globulin: 0.6 g/dL (ref 0.4–1.0)
Beta Globulin: 0.7 g/dL (ref 0.7–1.3)
GAMMA GLOBULIN: 0.8 g/dL (ref 0.4–1.8)
Globulin, Total: 2.3 g/dL (ref 2.2–3.9)
TOTAL PROTEIN ELP: 5.6 g/dL — AB (ref 6.0–8.5)

## 2018-05-27 ENCOUNTER — Telehealth: Payer: Self-pay

## 2018-05-27 NOTE — Telephone Encounter (Signed)
EMMI Follow-up: Noted on the report that the patient had other questions.  I talked with Mr. 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  He is eating and hoping to get to feeling stronger soon.  He said he was referred to medication management but they were only able to help with half of his medications. Said he would be out of medications soon so reminded him to ask MD at the clinic to write new prescriptions.

## 2019-12-10 IMAGING — CR DG CHEST 2V
1 series · 2 of 2 positions shown · non-contrast
Comparison: None.

CLINICAL DATA: Shortness of breath.  COPD exacerbation.  Smoker.

EXAM:
CHEST - 2 VIEW

[Series 1: dg chest 2 view · 0.14mm/px · 2 of 2 slices shown]
[im 1/2]
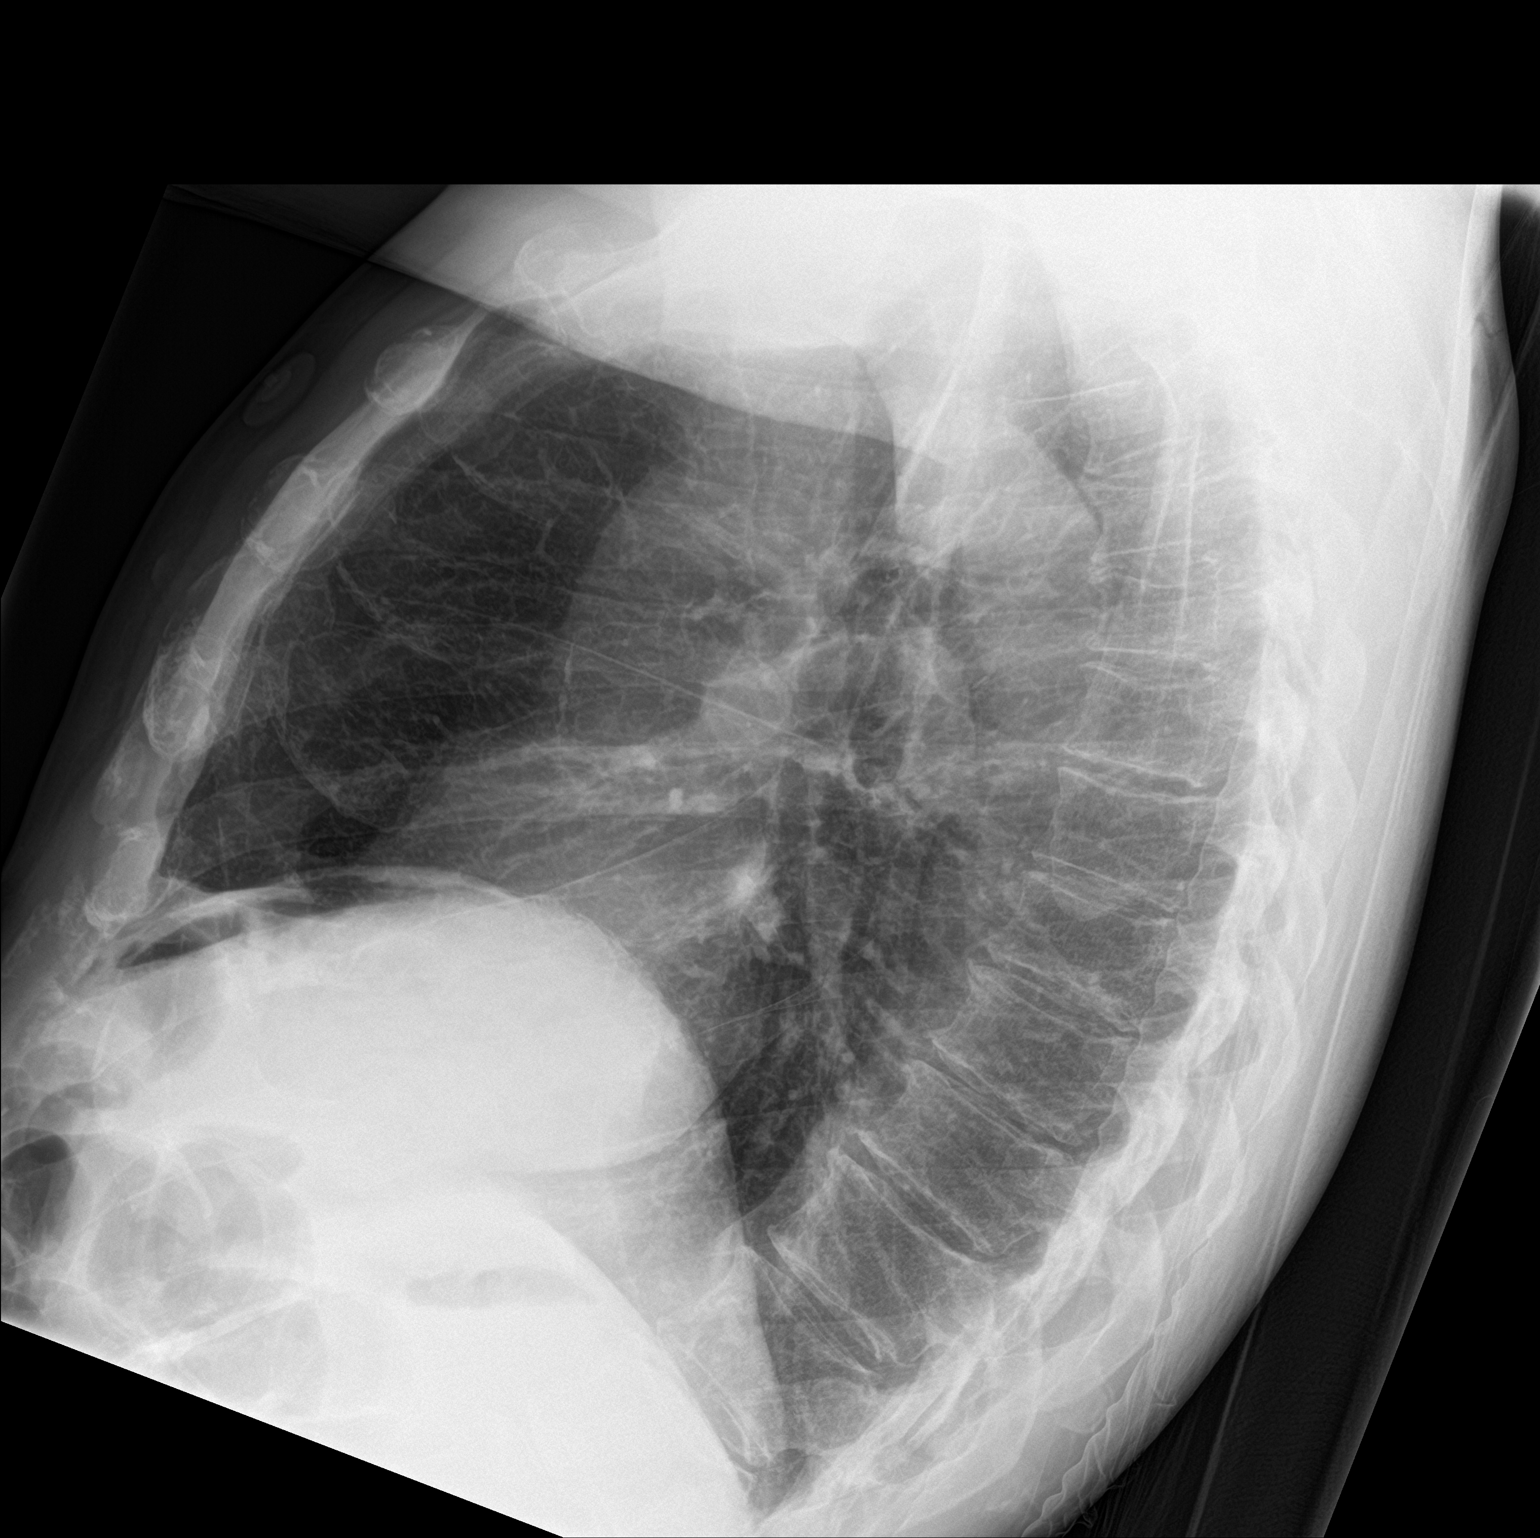
[im 2/2]
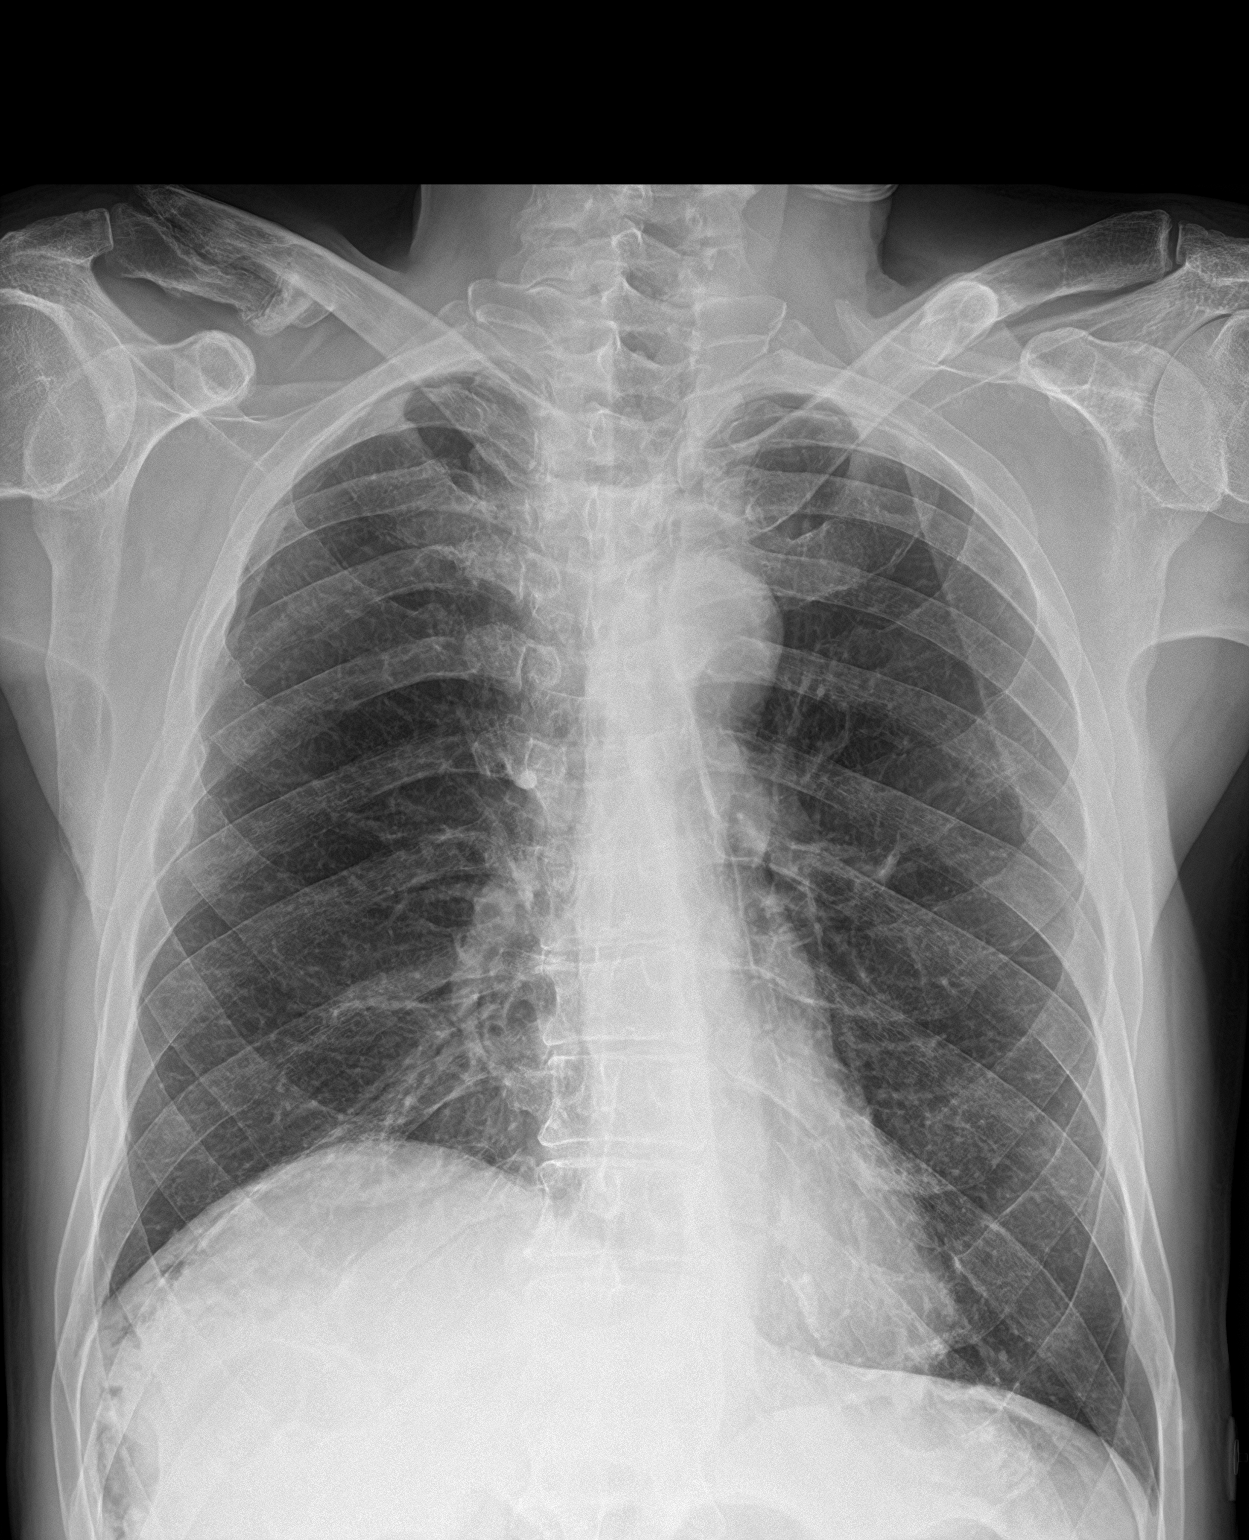

[2 of 2 positions shown; findings below may reference images not displayed]

FINDINGS: Normal sized heart. Clear lungs. The lungs are hyperexpanded. Mildly
elevated right hemidiaphragm. There is colon interposition on the
right, making it difficult to exclude free peritoneal air. Mild
scoliosis. Diffuse osteopenia. Old, healed bilateral clavicle
fractures. Old, healed bilateral rib fractures. Thoracic spine
degenerative changes. There are also mild compression deformities of
two of the upper thoracic spine vertebrae with no acute fracture
lines or bony retropulsion seen. Cervical spine degenerative
changes. Bilateral carotid artery calcifications, greater on the
right.
IMPRESSION: 1. Colon interposition on the right, making it difficult to exclude
free peritoneal air. If the patient has no abdominal symptoms, this
does not require further evaluation. If there are abdominal
symptoms, a two view radiographic examination of the abdomen would
be recommended, including a left lateral decubitus view.
2. Mild changes of COPD.
3. Bilateral atheromatous carotid artery calcifications, greater on
the right.

## 2020-09-27 ENCOUNTER — Other Ambulatory Visit: Payer: Self-pay | Admitting: Physician Assistant

## 2020-09-27 DIAGNOSIS — R634 Abnormal weight loss: Secondary | ICD-10-CM

## 2023-09-25 DEATH — deceased
